# Patient Record
Sex: Female | Born: 1980 | Race: Black or African American | Hispanic: No | Marital: Married | Smoking: Never smoker
Health system: Southern US, Community
[De-identification: ages and names within clinical notes are randomized; demographics above are authoritative.]

## PROBLEM LIST (undated history)

## (undated) ENCOUNTER — Inpatient Hospital Stay (HOSPITAL_COMMUNITY): Payer: Self-pay

## (undated) DIAGNOSIS — N643 Galactorrhea not associated with childbirth: Secondary | ICD-10-CM

## (undated) DIAGNOSIS — B370 Candidal stomatitis: Secondary | ICD-10-CM

## (undated) DIAGNOSIS — I10 Essential (primary) hypertension: Secondary | ICD-10-CM

## (undated) DIAGNOSIS — R87629 Unspecified abnormal cytological findings in specimens from vagina: Secondary | ICD-10-CM

## (undated) DIAGNOSIS — D649 Anemia, unspecified: Secondary | ICD-10-CM

## (undated) HISTORY — DX: Galactorrhea not associated with childbirth: N64.3

## (undated) HISTORY — DX: Unspecified abnormal cytological findings in specimens from vagina: R87.629

## (undated) HISTORY — DX: Candidal stomatitis: B37.0

## (undated) HISTORY — DX: Essential (primary) hypertension: I10

---

## 2006-04-17 ENCOUNTER — Emergency Department (HOSPITAL_COMMUNITY): Admission: EM | Admit: 2006-04-17 | Discharge: 2006-04-18 | Payer: Self-pay | Admitting: Emergency Medicine

## 2007-11-05 ENCOUNTER — Emergency Department (HOSPITAL_COMMUNITY): Admission: EM | Admit: 2007-11-05 | Discharge: 2007-11-05 | Payer: Self-pay | Admitting: Emergency Medicine

## 2008-03-27 ENCOUNTER — Encounter: Admission: RE | Admit: 2008-03-27 | Discharge: 2008-03-27 | Payer: Self-pay | Admitting: Gastroenterology

## 2008-03-29 ENCOUNTER — Ambulatory Visit (HOSPITAL_COMMUNITY): Admission: RE | Admit: 2008-03-29 | Discharge: 2008-03-29 | Payer: Self-pay | Admitting: Gastroenterology

## 2010-01-29 ENCOUNTER — Encounter: Admission: RE | Admit: 2010-01-29 | Discharge: 2010-01-29 | Payer: Self-pay | Admitting: Family Medicine

## 2011-10-20 HISTORY — PX: DILATION AND CURETTAGE OF UTERUS: SHX78

## 2012-06-24 ENCOUNTER — Ambulatory Visit (INDEPENDENT_AMBULATORY_CARE_PROVIDER_SITE_OTHER): Payer: Managed Care, Other (non HMO) | Admitting: Internal Medicine

## 2012-06-24 ENCOUNTER — Encounter: Payer: Self-pay | Admitting: Internal Medicine

## 2012-06-24 ENCOUNTER — Ambulatory Visit (INDEPENDENT_AMBULATORY_CARE_PROVIDER_SITE_OTHER)
Admission: RE | Admit: 2012-06-24 | Discharge: 2012-06-24 | Disposition: A | Discharge status: Home / Self Care | Payer: Managed Care, Other (non HMO) | Source: Ambulatory Visit | Attending: Internal Medicine | Admitting: Internal Medicine

## 2012-06-24 VITALS — BP 118/70 | HR 77 | Temp 98.2°F | Ht 62.0 in | Wt 156.4 lb

## 2012-06-24 DIAGNOSIS — R0602 Shortness of breath: Secondary | ICD-10-CM

## 2012-06-24 DIAGNOSIS — I10 Essential (primary) hypertension: Secondary | ICD-10-CM

## 2012-06-24 MED ORDER — NEBIVOLOL HCL 10 MG PO TABS: 10.0000 mg | ORAL_TABLET | Freq: Every day | ORAL | Status: DC

## 2012-06-24 NOTE — Progress Notes (Signed)
  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  15 min on eliptical sob and light headed and arthritis comes and go, doesn't really correlate with sob.  No obvious daytime variabilty or assoc chronic cough or cp or chest tightness, subjective wheeze overt sinus or hb symptoms. No unusual exp hx or h/o childhood pna/ asthma or premature birth to her knowledge.   ROS  The following are not active complaints unless bolded sore throat, dysphagia, dental problems, itching, sneezing,  nasal congestion or excess/ purulent secretions, ear ache,   fever, chills, sweats, unintended wt loss, pleuritic or exertional cp, hemoptysis,  orthopnea pnd or leg swelling, presyncope, palpitations, heartburn, abdominal pain, anorexia, nausea, vomiting, diarrhea  or change in bowel or urinary habits, change in stools or urine, dysuria,hematuria,  rash, arthralgias, visual complaints, headache, numbness weakness or ataxia or problems with walking or coordination,  change in mood/affect or memory.       Review of Systems  Constitutional: Negative for fever, chills and unexpected weight change.  HENT: Negative for ear pain, nosebleeds, congestion, sore throat, rhinorrhea, sneezing, trouble swallowing, dental problem, voice change, postnasal drip and sinus pressure.   Eyes: Negative for visual disturbance.  Respiratory: Positive for shortness of breath. Negative for cough and choking.   Cardiovascular: Positive for chest pain. Negative for leg swelling.  Gastrointestinal:  Negative for vomiting, abdominal pain and diarrhea.  Genitourinary: Negative for difficulty urinating.  Musculoskeletal: Positive for arthralgias.  Skin: Negative for rash.  Neurological: Negative for tremors, syncope and headaches.  Hematological: Does not bruise/bleed easily.       Objective:   Physical Exam  amb pleasant bf nad Wt Readings from Last 3 Encounters:  06/24/12 156 lb 6.4 oz (70.943 kg)    HEENT: nl dentition, turbinates, and orophanx. Nl external ear canals without cough reflex   NECK :  without JVD/Nodes/TM/ nl carotid upstrokes bilaterally   LUNGS: no acc muscle use, clear to A and P bilaterally without cough on insp or exp maneuvers   CV:  RRR  no s3 or murmur or increase in P2, no edema   ABD:  soft and nontender with nl excursion in the supine position. No bruits or organomegaly, bowel sounds nl  MS:  warm without deformities, calf tenderness, cyanosis or clubbing  SKIN: warm and dry without lesions    NEURO:  alert, approp, no deficits    CXR  06/24/2012 :  Negative.          Assessment & Plan:

## 2012-06-24 NOTE — Patient Instructions (Addendum)
Stop labetolol an start bystolic 10 mg one twice daily in its place.  Call [facility] [phone] to schedule cpst with spirometry before and after but only 2 weeks on prilosec 20 mg Take 30-60 min before first meal of the day and Pepcid 20 g at bedtime   Please remember to go to the xray   department downstairs for your tests - we will call you with the results when they are available.

## 2012-06-25 DIAGNOSIS — I10 Essential (primary) hypertension: Secondary | ICD-10-CM | POA: Insufficient documentation

## 2012-06-25 NOTE — Assessment & Plan Note (Addendum)
-   06/24/2012  Walked RA x 3 laps @ 185 ft each stopped due to  End of study, no desats, peak pulse 97 -Spirometry wnl p exercise 06/24/2012   Symptoms are markedly disproportionate to objective findings and not clear this is a lung problem but pt does appear to have difficult airway management issues.   DDX of  difficult airways managment all start with A and  include Adherence, Ace Inhibitors, Acid Reflux, Active Sinus Disease, Alpha 1 Antitripsin deficiency, Anxiety masquerading as Airways dz,  ABPA,  allergy(esp in young), Aspiration (esp in elderly), Adverse effects of DPI,  Active smokers, plus two Bs  = Bronchiectasis and Beta blocker use..and one C= CHF   Note problem started on labetolol and taking in relatively high doses so makes sense to try bystolic > see hbp section  If not better then cpst with  before and after spirometry is next step.

## 2012-06-25 NOTE — Assessment & Plan Note (Signed)
Strongly prefer in this setting: Bystolic, the most beta -1  selective Beta blocker available in sample form, with bisoprolol the most selective generic choice  on the market.  

## 2012-07-15 NOTE — Progress Notes (Signed)
Quick Note:  Spoke with pt and notified of results per Dr. Wert. Pt verbalized understanding and denied any questions.  ______ 

## 2013-02-24 ENCOUNTER — Other Ambulatory Visit: Payer: Self-pay

## 2013-04-21 NOTE — L&D Delivery Note (Signed)
Delivery Note At 3:32 PM a viable and healthy female was delivered via Vaginal, Spontaneous Delivery (Presentation: Right Occiput Anterior).  APGAR: 9, 10; weight 5 lb 13 oz (2637 g).   Placenta status: Intact, Spontaneous. Not sent  Cord: 3 vessels with the following complications: .  Cord pH: none  Anesthesia: None  Episiotomy: None Lacerations: None Suture Repair: n/a Est. Blood Loss (mL): 250  Mom to postpartum.  Baby to Couplet care / Skin to Skin.  Dariel Pellecchia A 04/03/2014, 5:54 PM

## 2013-09-08 LAB — OB RESULTS CONSOLE RUBELLA ANTIBODY, IGM: RUBELLA: IMMUNE

## 2013-09-08 LAB — OB RESULTS CONSOLE ANTIBODY SCREEN: ANTIBODY SCREEN: NEGATIVE

## 2013-09-08 LAB — OB RESULTS CONSOLE RPR: RPR: NONREACTIVE

## 2013-09-08 LAB — OB RESULTS CONSOLE ABO/RH: RH Type: POSITIVE

## 2013-09-08 LAB — OB RESULTS CONSOLE GC/CHLAMYDIA
Chlamydia: NEGATIVE
Gonorrhea: NEGATIVE

## 2013-09-08 LAB — OB RESULTS CONSOLE HEPATITIS B SURFACE ANTIGEN: Hepatitis B Surface Ag: NEGATIVE

## 2013-09-08 LAB — OB RESULTS CONSOLE HIV ANTIBODY (ROUTINE TESTING): HIV: NONREACTIVE

## 2014-03-22 ENCOUNTER — Inpatient Hospital Stay (HOSPITAL_COMMUNITY)
Admission: AD | Admit: 2014-03-22 | Discharge: 2014-03-22 | Disposition: A | Discharge status: Home / Self Care | Payer: Managed Care, Other (non HMO) | Source: Ambulatory Visit | Attending: Obstetrics and Gynecology | Admitting: Obstetrics and Gynecology

## 2014-03-22 ENCOUNTER — Encounter (HOSPITAL_COMMUNITY): Payer: Self-pay

## 2014-03-22 DIAGNOSIS — Z3A36 36 weeks gestation of pregnancy: Secondary | ICD-10-CM | POA: Insufficient documentation

## 2014-03-22 DIAGNOSIS — O10013 Pre-existing essential hypertension complicating pregnancy, third trimester: Secondary | ICD-10-CM | POA: Insufficient documentation

## 2014-03-22 HISTORY — DX: Anemia, unspecified: D64.9

## 2014-03-22 LAB — CBC
HCT: 32.3 % — ABNORMAL LOW (ref 36.0–46.0)
HEMOGLOBIN: 10.6 g/dL — AB (ref 12.0–15.0)
MCH: 28.8 pg (ref 26.0–34.0)
MCHC: 32.8 g/dL (ref 30.0–36.0)
MCV: 87.8 fL (ref 78.0–100.0)
PLATELETS: 156 10*3/uL (ref 150–400)
RBC: 3.68 MIL/uL — AB (ref 3.87–5.11)
RDW: 13.6 % (ref 11.5–15.5)
WBC: 6.1 10*3/uL (ref 4.0–10.5)

## 2014-03-22 LAB — COMPREHENSIVE METABOLIC PANEL
ALK PHOS: 113 U/L (ref 39–117)
ALT: 15 U/L (ref 0–35)
AST: 18 U/L (ref 0–37)
Albumin: 2.8 g/dL — ABNORMAL LOW (ref 3.5–5.2)
Anion gap: 13 (ref 5–15)
BUN: 6 mg/dL (ref 6–23)
CHLORIDE: 101 meq/L (ref 96–112)
CO2: 22 meq/L (ref 19–32)
Calcium: 9.1 mg/dL (ref 8.4–10.5)
Creatinine, Ser: 0.65 mg/dL (ref 0.50–1.10)
GLUCOSE: 94 mg/dL (ref 70–99)
POTASSIUM: 3.8 meq/L (ref 3.7–5.3)
SODIUM: 136 meq/L — AB (ref 137–147)
Total Bilirubin: 0.6 mg/dL (ref 0.3–1.2)
Total Protein: 6.7 g/dL (ref 6.0–8.3)

## 2014-03-22 LAB — PROTEIN / CREATININE RATIO, URINE
Creatinine, Urine: 130.84 mg/dL
Protein Creatinine Ratio: 0.16 — ABNORMAL HIGH (ref 0.00–0.15)
Total Protein, Urine: 20.9 mg/dL

## 2014-03-22 LAB — URIC ACID: Uric Acid, Serum: 4.8 mg/dL (ref 2.4–7.0)

## 2014-03-22 MED ORDER — NIFEDIPINE ER OSMOTIC RELEASE 30 MG PO TB24
30.0000 mg | ORAL_TABLET | Freq: Once | ORAL | Status: AC
  Administered 2014-03-22: 30 mg via ORAL
  Filled 2014-03-22: qty 1

## 2014-03-22 NOTE — Discharge Instructions (Signed)
Third Trimester of Pregnancy °The third trimester is from week 29 through week 42, months 7 through 9. The third trimester is a time when the fetus is growing rapidly. At the end of the ninth month, the fetus is about 20 inches in length and weighs 6-10 pounds.  °BODY CHANGES °Your body goes through many changes during pregnancy. The changes vary from woman to woman.  °· Your weight will continue to increase. You can expect to gain 25-35 pounds (11-16 kg) by the end of the pregnancy. °· You may begin to get stretch marks on your hips, abdomen, and breasts. °· You may urinate more often because the fetus is moving lower into your pelvis and pressing on your bladder. °· You may develop or continue to have heartburn as a result of your pregnancy. °· You may develop constipation because certain hormones are causing the muscles that push waste through your intestines to slow down. °· You may develop hemorrhoids or swollen, bulging veins (varicose veins). °· You may have pelvic pain because of the weight gain and pregnancy hormones relaxing your joints between the bones in your pelvis. Backaches may result from overexertion of the muscles supporting your posture. °· You may have changes in your hair. These can include thickening of your hair, rapid growth, and changes in texture. Some women also have hair loss during or after pregnancy, or hair that feels dry or thin. Your hair will most likely return to normal after your baby is born. °· Your breasts will continue to grow and be tender. A yellow discharge may leak from your breasts called colostrum. °· Your belly button may stick out. °· You may feel short of breath because of your expanding uterus. °· You may notice the fetus "dropping," or moving lower in your abdomen. °· You may have a bloody mucus discharge. This usually occurs a few days to a week before labor begins. °· Your cervix becomes thin and soft (effaced) near your due date. °WHAT TO EXPECT AT YOUR PRENATAL  EXAMS  °You will have prenatal exams every 2 weeks until week 36. Then, you will have weekly prenatal exams. During a routine prenatal visit: °· You will be weighed to make sure you and the fetus are growing normally. °· Your blood pressure is taken. °· Your abdomen will be measured to track your baby's growth. °· The fetal heartbeat will be listened to. °· Any test results from the previous visit will be discussed. °· You may have a cervical check near your due date to see if you have effaced. °At around 36 weeks, your caregiver will check your cervix. At the same time, your caregiver will also perform a test on the secretions of the vaginal tissue. This test is to determine if a type of bacteria, Group B streptococcus, is present. Your caregiver will explain this further. °Your caregiver may ask you: °· What your birth plan is. °· How you are feeling. °· If you are feeling the baby move. °· If you have had any abnormal symptoms, such as leaking fluid, bleeding, severe headaches, or abdominal cramping. °· If you have any questions. °Other tests or screenings that may be performed during your third trimester include: °· Blood tests that check for low iron levels (anemia). °· Fetal testing to check the health, activity level, and growth of the fetus. Testing is done if you have certain medical conditions or if there are problems during the pregnancy. °FALSE LABOR °You may feel small, irregular contractions that   eventually go away. These are called Braxton Hicks contractions, or false labor. Contractions may last for hours, days, or even weeks before true labor sets in. If contractions come at regular intervals, intensify, or become painful, it is best to be seen by your caregiver.  °SIGNS OF LABOR  °· Menstrual-like cramps. °· Contractions that are 5 minutes apart or less. °· Contractions that start on the top of the uterus and spread down to the lower abdomen and back. °· A sense of increased pelvic pressure or back  pain. °· A watery or bloody mucus discharge that comes from the vagina. °If you have any of these signs before the 37th week of pregnancy, call your caregiver right away. You need to go to the hospital to get checked immediately. °HOME CARE INSTRUCTIONS  °· Avoid all smoking, herbs, alcohol, and unprescribed drugs. These chemicals affect the formation and growth of the baby. °· Follow your caregiver's instructions regarding medicine use. There are medicines that are either safe or unsafe to take during pregnancy. °· Exercise only as directed by your caregiver. Experiencing uterine cramps is a good sign to stop exercising. °· Continue to eat regular, healthy meals. °· Wear a good support bra for breast tenderness. °· Do not use hot tubs, steam rooms, or saunas. °· Wear your seat belt at all times when driving. °· Avoid raw meat, uncooked cheese, cat litter boxes, and soil used by cats. These carry germs that can cause birth defects in the baby. °· Take your prenatal vitamins. °· Try taking a stool softener (if your caregiver approves) if you develop constipation. Eat more high-fiber foods, such as fresh vegetables or fruit and whole grains. Drink plenty of fluids to keep your urine clear or pale yellow. °· Take warm sitz baths to soothe any pain or discomfort caused by hemorrhoids. Use hemorrhoid cream if your caregiver approves. °· If you develop varicose veins, wear support hose. Elevate your feet for 15 minutes, 3-4 times a day. Limit salt in your diet. °· Avoid heavy lifting, wear low heal shoes, and practice good posture. °· Rest a lot with your legs elevated if you have leg cramps or low back pain. °· Visit your dentist if you have not gone during your pregnancy. Use a soft toothbrush to brush your teeth and be gentle when you floss. °· A sexual relationship may be continued unless your caregiver directs you otherwise. °· Do not travel far distances unless it is absolutely necessary and only with the approval  of your caregiver. °· Take prenatal classes to understand, practice, and ask questions about the labor and delivery. °· Make a trial run to the hospital. °· Pack your hospital bag. °· Prepare the baby's nursery. °· Continue to go to all your prenatal visits as directed by your caregiver. °SEEK MEDICAL CARE IF: °· You are unsure if you are in labor or if your water has broken. °· You have dizziness. °· You have mild pelvic cramps, pelvic pressure, or nagging pain in your abdominal area. °· You have persistent nausea, vomiting, or diarrhea. °· You have a bad smelling vaginal discharge. °· You have pain with urination. °SEEK IMMEDIATE MEDICAL CARE IF:  °· You have a fever. °· You are leaking fluid from your vagina. °· You have spotting or bleeding from your vagina. °· You have severe abdominal cramping or pain. °· You have rapid weight loss or gain. °· You have shortness of breath with chest pain. °· You notice sudden or extreme swelling   of your face, hands, ankles, feet, or legs. °· You have not felt your baby move in over an hour. °· You have severe headaches that do not go away with medicine. °· You have vision changes. °Document Released: 04/01/2001 Document Revised: 04/12/2013 Document Reviewed: 06/08/2012 °[employer]® Patient Information ©2015 [employer]. This information is not intended to replace advice given to you by your health care provider. Make sure you discuss any questions you have with your health care provider. °Fetal Movement Counts °Patient Name: __________________________________________________ Patient Due Date: ____________________ °Performing a fetal movement count is highly recommended in high-risk pregnancies, but it is good for every pregnant woman to do. Your health care provider may ask you to start counting fetal movements at 28 weeks of the pregnancy. Fetal movements often increase: °· After eating a full meal. °· After physical activity. °· After eating or drinking something sweet or  cold. °· At rest. °Pay attention to when you feel the baby is most active. This will help you notice a pattern of your baby's sleep and wake cycles and what factors contribute to an increase in fetal movement. It is important to perform a fetal movement count at the same time each day when your baby is normally most active.  °HOW TO COUNT FETAL MOVEMENTS °· Find a quiet and comfortable area to sit or lie down on your left side. Lying on your left side provides the best blood and oxygen circulation to your baby. °· Write down the day and time on a sheet of paper or in a journal. °· Start counting kicks, flutters, swishes, rolls, or jabs in a 2-hour period. You should feel at least 10 movements within 2 hours. °· If you do not feel 10 movements in 2 hours, wait 2-3 hours and count again. Look for a change in the pattern or not enough counts in 2 hours. °SEEK MEDICAL CARE IF: °· You feel less than 10 counts in 2 hours, tried twice. °· There is no movement in over an hour. °· The pattern is changing or taking longer each day to reach 10 counts in 2 hours. °· You feel the baby is not moving as he or she usually does. °Date: ____________ Movements: ____________ Start time: ____________ Finish time: ____________  °Date: ____________ Movements: ____________ Start time: ____________ Finish time: ____________ °Date: ____________ Movements: ____________ Start time: ____________ Finish time: ____________ °Date: ____________ Movements: ____________ Start time: ____________ Finish time: ____________ °Date: ____________ Movements: ____________ Start time: ____________ Finish time: ____________ °Date: ____________ Movements: ____________ Start time: ____________ Finish time: ____________ °Date: ____________ Movements: ____________ Start time: ____________ Finish time: ____________ °Date: ____________ Movements: ____________ Start time: ____________ Finish time: ____________  °Date: ____________ Movements: ____________ Start time:  ____________ Finish time: ____________ °Date: ____________ Movements: ____________ Start time: ____________ Finish time: ____________ °Date: ____________ Movements: ____________ Start time: ____________ Finish time: ____________ °Date: ____________ Movements: ____________ Start time: ____________ Finish time: ____________ °Date: ____________ Movements: ____________ Start time: ____________ Finish time: ____________ °Date: ____________ Movements: ____________ Start time: ____________ Finish time: ____________ °Date: ____________ Movements: ____________ Start time: ____________ Finish time: ____________  °Date: ____________ Movements: ____________ Start time: ____________ Finish time: ____________ °Date: ____________ Movements: ____________ Start time: ____________ Finish time: ____________ °Date: ____________ Movements: ____________ Start time: ____________ Finish time: ____________ °Date: ____________ Movements: ____________ Start time: ____________ Finish time: ____________ °Date: ____________ Movements: ____________ Start time: ____________ Finish time: ____________ °Date: ____________ Movements: ____________ Start time: ____________ Finish time: ____________ °Date: ____________ Movements: ____________ Start time: ____________ Finish time:   ____________  °Date: ____________ Movements: ____________ Start time: ____________ Finish time: ____________ °Date: ____________ Movements: ____________ Start time: ____________ Finish time: ____________ °Date: ____________ Movements: ____________ Start time: ____________ Finish time: ____________ °Date: ____________ Movements: ____________ Start time: ____________ Finish time: ____________ °Date: ____________ Movements: ____________ Start time: ____________ Finish time: ____________ °Date: ____________ Movements: ____________ Start time: ____________ Finish time: ____________ °Date: ____________ Movements: ____________ Start time: ____________ Finish time: ____________  °Date:  ____________ Movements: ____________ Start time: ____________ Finish time: ____________ °Date: ____________ Movements: ____________ Start time: ____________ Finish time: ____________ °Date: ____________ Movements: ____________ Start time: ____________ Finish time: ____________ °Date: ____________ Movements: ____________ Start time: ____________ Finish time: ____________ °Date: ____________ Movements: ____________ Start time: ____________ Finish time: ____________ °Date: ____________ Movements: ____________ Start time: ____________ Finish time: ____________ °Date: ____________ Movements: ____________ Start time: ____________ Finish time: ____________  °Date: ____________ Movements: ____________ Start time: ____________ Finish time: ____________ °Date: ____________ Movements: ____________ Start time: ____________ Finish time: ____________ °Date: ____________ Movements: ____________ Start time: ____________ Finish time: ____________ °Date: ____________ Movements: ____________ Start time: ____________ Finish time: ____________ °Date: ____________ Movements: ____________ Start time: ____________ Finish time: ____________ °Date: ____________ Movements: ____________ Start time: ____________ Finish time: ____________ °Date: ____________ Movements: ____________ Start time: ____________ Finish time: ____________  °Date: ____________ Movements: ____________ Start time: ____________ Finish time: ____________ °Date: ____________ Movements: ____________ Start time: ____________ Finish time: ____________ °Date: ____________ Movements: ____________ Start time: ____________ Finish time: ____________ °Date: ____________ Movements: ____________ Start time: ____________ Finish time: ____________ °Date: ____________ Movements: ____________ Start time: ____________ Finish time: ____________ °Date: ____________ Movements: ____________ Start time: ____________ Finish time: ____________ °Date: ____________ Movements: ____________ Start  time: ____________ Finish time: ____________  °Date: ____________ Movements: ____________ Start time: ____________ Finish time: ____________ °Date: ____________ Movements: ____________ Start time: ____________ Finish time: ____________ °Date: ____________ Movements: ____________ Start time: ____________ Finish time: ____________ °Date: ____________ Movements: ____________ Start time: ____________ Finish time: ____________ °Date: ____________ Movements: ____________ Start time: ____________ Finish time: ____________ °Date: ____________ Movements: ____________ Start time: ____________ Finish time: ____________ °Document Released: 05/07/2006 Document Revised: 08/22/2013 Document Reviewed: 02/02/2012 °[employer]® Patient Information ©2015 [employer]. This information is not intended to replace advice given to you by your health care provider. Make sure you discuss any questions you have with your health care provider. ° °

## 2014-03-22 NOTE — MAU Provider Note (Signed)
History     Chief Complaint  Patient presents with  . BP Eval   HPI; 33 yo [id number] MBF @ 36  3/[redacted] weeks gestation with known chronic HTN  Sent from  Office for evaluation of superimposed preeclampsia vs worsening HTN. Pt started pregnancy on procardia and bystolic however pt began having low BP and symptomatic due to the BP resulting in BP meds being discontinued. Pt did well until a week ago when first elevation of BP noted. PIH labs done were normal. Today pt presents for routine care and was found to have BP 150/100. Pt here for serial BP, PIH labs and NST. Sono done in office had BPP 8/8, 5lb 14 oz baby( 41%) AC( 9%) nl dopplers. Pt denies PIH warning signs. (+) active fetus. Notes more frequent ctx. Hx PTB OB History    Gravida Para Term Preterm AB TAB SAB Ectopic Multiple Living   3 2 1 1      2       Past Medical History  Diagnosis Date  . Hypertension   . Anemia     Past Surgical History  Procedure Laterality Date  . Dilation and curettage of uterus  July 2013    Family History  Problem Relation Age of Onset  . Asthma Maternal Grandmother   . Asthma Maternal Aunt     History  Substance Use Topics  . Smoking status: Never Smoker   . Smokeless tobacco: Never Used  . Alcohol Use: No    Allergies: No Known Allergies  Prescriptions prior to admission  Medication Sig Dispense Refill Last Dose  . aspirin 81 MG tablet Take 81 mg by mouth daily.   03/21/2014 at Unknown time  . folic acid (FOLVITE) 1 MG tablet Take 1 tablet by mouth daily.   03/21/2014 at Unknown time  . Prenatal Vit-Fe Fumarate-FA (PRENATAL MULTIVITAMIN) TABS tablet Take 1 tablet by mouth daily at 12 noon.   03/21/2014 at Unknown time  . nebivolol (BYSTOLIC) 10 MG tablet Take 1 tablet (10 mg total) by mouth daily. (Patient not taking: Reported on 03/22/2014) 60 tablet 11      Physical Exam   Blood pressure 135/88, pulse 115, temperature 98 F (36.7 C), resp. rate 18.  No exam performed today, done in  office. pt sent for labs and serial BP/NST. CBC    Component Value Date/Time   WBC 6.1 03/22/2014 1324   RBC 3.68* 03/22/2014 1324   HGB 10.6* 03/22/2014 1324   HCT 32.3* 03/22/2014 1324   PLT 156 03/22/2014 1324   MCV 87.8 03/22/2014 1324   MCH 28.8 03/22/2014 1324   MCHC 32.8 03/22/2014 1324   RDW 13.6 03/22/2014 1324   CMP     Component Value Date/Time   NA 136* 03/22/2014 1324   K 3.8 03/22/2014 1324   CL 101 03/22/2014 1324   CO2 22 03/22/2014 1324   GLUCOSE 94 03/22/2014 1324   BUN 6 03/22/2014 1324   CREATININE 0.65 03/22/2014 1324   CALCIUM 9.1 03/22/2014 1324   PROT 6.7 03/22/2014 1324   ALBUMIN 2.8* 03/22/2014 1324   AST 18 03/22/2014 1324   ALT 15 03/22/2014 1324   ALKPHOS 113 03/22/2014 1324   BILITOT 0.6 03/22/2014 1324   GFRNONAA >90 03/22/2014 1324   GFRAA >90 03/22/2014 1324   Tracing: baseline 145 (+) accels  ctx q 2-5 mins with uterine irritability  ED Course   Chronic HTn w/o evidence of superimposed preeclampsia IUP @ 36 3/7 weeks P) resume  procardia XL. Start today. PIH warning signs. F/u office Monday. Labor prec MDM   Serita KyleOUSINS,Treva Huyett A, MD 2:10 PM 03/22/2014

## 2014-03-22 NOTE — MAU Note (Signed)
Pt here from Dr. Cherly Hensenousins office for BP eval. Denies headache, vision changes or pain. Reports good fetal movement. Denies vaginal bleeding, discharge or leaking of fluid.

## 2014-03-22 NOTE — Progress Notes (Signed)
Dr. Cherly Hensenousins notified of pt lab results, FHR tracing. Received orders to discharge pt and write note for work. Preeclampsia signs and symptoms reviewed.

## 2014-03-27 ENCOUNTER — Telehealth (HOSPITAL_COMMUNITY): Payer: Self-pay | Admitting: *Deleted

## 2014-03-27 ENCOUNTER — Encounter (HOSPITAL_COMMUNITY): Payer: Self-pay | Admitting: *Deleted

## 2014-03-27 NOTE — Telephone Encounter (Signed)
Preadmission screen  

## 2014-03-28 ENCOUNTER — Encounter (HOSPITAL_COMMUNITY): Payer: Self-pay | Admitting: *Deleted

## 2014-03-28 ENCOUNTER — Inpatient Hospital Stay (HOSPITAL_COMMUNITY)
Admission: AD | Admit: 2014-03-28 | Discharge: 2014-03-28 | Disposition: A | Discharge status: Home / Self Care | Payer: Managed Care, Other (non HMO) | Source: Ambulatory Visit | Attending: Obstetrics & Gynecology | Admitting: Obstetrics & Gynecology

## 2014-03-28 ENCOUNTER — Inpatient Hospital Stay (HOSPITAL_COMMUNITY): Payer: Managed Care, Other (non HMO)

## 2014-03-28 DIAGNOSIS — O368190 Decreased fetal movements, unspecified trimester, not applicable or unspecified: Secondary | ICD-10-CM | POA: Insufficient documentation

## 2014-03-28 DIAGNOSIS — Z3A37 37 weeks gestation of pregnancy: Secondary | ICD-10-CM | POA: Diagnosis not present

## 2014-03-28 DIAGNOSIS — O10013 Pre-existing essential hypertension complicating pregnancy, third trimester: Secondary | ICD-10-CM | POA: Insufficient documentation

## 2014-03-28 DIAGNOSIS — O368130 Decreased fetal movements, third trimester, not applicable or unspecified: Secondary | ICD-10-CM | POA: Insufficient documentation

## 2014-03-28 DIAGNOSIS — O139 Gestational [pregnancy-induced] hypertension without significant proteinuria, unspecified trimester: Secondary | ICD-10-CM | POA: Insufficient documentation

## 2014-03-28 DIAGNOSIS — O368131 Decreased fetal movements, third trimester, fetus 1: Secondary | ICD-10-CM

## 2014-03-28 LAB — COMPREHENSIVE METABOLIC PANEL
ALK PHOS: 124 U/L — AB (ref 39–117)
ALT: 13 U/L (ref 0–35)
ANION GAP: 14 (ref 5–15)
AST: 17 U/L (ref 0–37)
Albumin: 2.9 g/dL — ABNORMAL LOW (ref 3.5–5.2)
BILIRUBIN TOTAL: 0.7 mg/dL (ref 0.3–1.2)
BUN: 8 mg/dL (ref 6–23)
CO2: 21 meq/L (ref 19–32)
Calcium: 9.1 mg/dL (ref 8.4–10.5)
Chloride: 103 mEq/L (ref 96–112)
Creatinine, Ser: 0.72 mg/dL (ref 0.50–1.10)
GLUCOSE: 74 mg/dL (ref 70–99)
POTASSIUM: 3.9 meq/L (ref 3.7–5.3)
SODIUM: 138 meq/L (ref 137–147)
Total Protein: 6.9 g/dL (ref 6.0–8.3)

## 2014-03-28 LAB — URIC ACID: Uric Acid, Serum: 5.3 mg/dL (ref 2.4–7.0)

## 2014-03-28 LAB — URINE MICROSCOPIC-ADD ON

## 2014-03-28 LAB — CBC
HEMATOCRIT: 32.4 % — AB (ref 36.0–46.0)
HEMOGLOBIN: 11 g/dL — AB (ref 12.0–15.0)
MCH: 29.9 pg (ref 26.0–34.0)
MCHC: 34 g/dL (ref 30.0–36.0)
MCV: 88 fL (ref 78.0–100.0)
Platelets: 164 10*3/uL (ref 150–400)
RBC: 3.68 MIL/uL — AB (ref 3.87–5.11)
RDW: 13.4 % (ref 11.5–15.5)
WBC: 5.2 10*3/uL (ref 4.0–10.5)

## 2014-03-28 LAB — URINALYSIS, ROUTINE W REFLEX MICROSCOPIC
BILIRUBIN URINE: NEGATIVE
GLUCOSE, UA: NEGATIVE mg/dL
KETONES UR: NEGATIVE mg/dL
LEUKOCYTES UA: NEGATIVE
NITRITE: NEGATIVE
PH: 6 (ref 5.0–8.0)
Protein, ur: NEGATIVE mg/dL
Urobilinogen, UA: 0.2 mg/dL (ref 0.0–1.0)

## 2014-03-28 NOTE — MAU Note (Signed)
E-sign not working. Pt signed hard copy. No pain

## 2014-03-28 NOTE — Progress Notes (Signed)
Dr Cherly Hensenousins and Donette LarryMelanie Bhambri CNM in to see pt. Discussing lab results and d/c plan. Written and verbal d/c instructions given and understanding voiced. Preeclampsia s/s discussed and handout given

## 2014-03-28 NOTE — Discharge Instructions (Signed)

## 2014-03-28 NOTE — MAU Provider Note (Signed)
History     CSN: [id number]  Arrival date and time: 03/28/14 1637   First Provider Initiated Contact with Patient 03/28/14 1837      Chief Complaint  Patient presents with  . Decreased Fetal Movement   HPI Comments: G3P1102 @37 .2 wks here for c/o decreased FM today. No LOF or VB. Occ BH ctx. Pregnancy complicated by chronic HTN, not on meds during pregnancy but started on Procardia 1 wk ago d/t BP elevations. PIH labs normal at that time. No HA, visual disturbances, or epigastric pain. Reports feeling "cloudy" at times.   OB History    Gravida Para Term Preterm AB TAB SAB Ectopic Multiple Living   3 2 1 1      2       Past Medical History  Diagnosis Date  . Hypertension   . Anemia   . Candidiasis of mouth   . Vaginal Pap smear, abnormal   . Galactorrhea not associated with childbirth     Past Surgical History  Procedure Laterality Date  . Dilation and curettage of uterus  July 2013    Family History  Problem Relation Age of Onset  . Asthma Maternal Grandmother   . Hypertension Maternal Grandmother   . Asthma Maternal Aunt   . Hypertension Mother   . Hypertension Father   . Hypertension Maternal Grandfather     History  Substance Use Topics  . Smoking status: Never Smoker   . Smokeless tobacco: Never Used  . Alcohol Use: No    Allergies: No Known Allergies  Prescriptions prior to admission  Medication Sig Dispense Refill Last Dose  . aspirin 81 MG tablet Take 81 mg by mouth daily.   03/27/2014 at Unknown time  . folic acid (FOLVITE) 1 MG tablet Take 1 tablet by mouth daily.   03/27/2014 at Unknown time  . NIFEdipine (PROCARDIA-XL/ADALAT-CC/NIFEDICAL-XL) 30 MG 24 hr tablet Take 30 mg by mouth daily.   03/27/2014 at Unknown time  . Prenatal Vit-Fe Fumarate-FA (PRENATAL MULTIVITAMIN) TABS tablet Take 1 tablet by mouth daily at 12 noon.   03/27/2014 at Unknown time  . nebivolol (BYSTOLIC) 10 MG tablet Take 1 tablet (10 mg total) by mouth daily. (Patient not taking:  Reported on 03/22/2014) 60 tablet 11     Review of Systems  Constitutional: Negative.   HENT: Negative.   Eyes: Negative.   Respiratory: Negative.   Cardiovascular: Negative.   Gastrointestinal: Negative.   Genitourinary: Negative.   Musculoskeletal: Negative.   Skin: Negative.   Neurological: Negative.   Endo/Heme/Allergies: Negative.   Psychiatric/Behavioral: Negative.    Physical Exam   Blood pressure 134/71, pulse 92. BP range: 134-161/71-99  Physical Exam  Constitutional: She is oriented to person, place, and time. She appears well-developed and well-nourished.  HENT:  Head: Normocephalic and atraumatic.  Eyes: Pupils are equal, round, and reactive to light.  Neck: Normal range of motion. Neck supple.  Cardiovascular: Normal rate.   Respiratory: Effort normal.  GI: Soft.  Genitourinary:  deferred  Musculoskeletal: Normal range of motion.  Neurological: She is alert and oriented to person, place, and time.  Skin: Skin is warm and dry.  Psychiatric: She has a normal mood and affect.  EFM: 150 bpm, mod variability, +accels, no decels Toco: irregular, mild  Results for Loretta Dixon, Loretta Dixon (MRN [id number]) as of 03/28/2014 19:02  Ref. Range 03/28/2014 17:35  Sodium Latest Range: 137-147 mEq/L 138  Potassium Latest Range: 3.7-5.3 mEq/L 3.9  Chloride Latest Range: 96-112 mEq/L 103  CO2 Latest  Range: 19-32 mEq/L 21  BUN Latest Range: 6-23 mg/dL 8  Creatinine Latest Range: 0.50-1.10 mg/dL 7.420.72  Calcium Latest Range: 8.4-10.5 mg/dL 9.1  GFR calc non Af Amer Latest Range: >90 mL/min >90  GFR calc Af Amer Latest Range: >90 mL/min >90  Glucose Latest Range: 70-99 mg/dL 74  Anion gap Latest Range: 5-15  14  Alkaline Phosphatase Latest Range: 39-117 U/L 124 (H)  Albumin Latest Range: 3.5-5.2 g/dL 2.9 (L)  Uric Acid, Serum Latest Range: 2.4-7.0 mg/dL 5.3  AST Latest Range: 0-37 U/L 17  ALT Latest Range: 0-35 U/L 13  Total Protein Latest Range: 6.0-8.3 g/dL 6.9  Total  Bilirubin Latest Range: 0.3-1.2 mg/dL 0.7  WBC Latest Range: 4.0-10.5 K/uL 5.2  RBC Latest Range: 3.87-5.11 MIL/uL 3.68 (L)  Hemoglobin Latest Range: 12.0-15.0 g/dL 59.511.0 (L)  HCT Latest Range: 36.0-46.0 % 32.4 (L)  MCV Latest Range: 78.0-100.0 fL 88.0  MCH Latest Range: 26.0-34.0 pg 29.9  MCHC Latest Range: 30.0-36.0 g/dL 63.834.0  RDW Latest Range: 11.5-15.5 % 13.4  Platelets Latest Range: 150-400 K/uL 164   MAU Course  Procedures NST: reactive BPP: 8/9; AFI 16.57 cm Reported increased FM since arrival  MDM n/a  Assessment and Plan  37.[redacted] weeks gestation Decreased fetal movement Gestational HTN Reactive NST    Loretta Dixon, N 03/28/2014, 6:55 PM

## 2014-03-28 NOTE — Progress Notes (Signed)
Fabian NovemberM Bhambri notified of pt's admission and status. Aware of decreased FM today, had reactive nst yest. Hx HTN and sl IUGR. Orders received and will see pt

## 2014-03-28 NOTE — MAU Note (Addendum)
Loretta LarryMelanie Bhambri CNM in to see pt and discuss test results. OK to leave pt off efm per CNM

## 2014-03-29 DIAGNOSIS — Z3A37 37 weeks gestation of pregnancy: Secondary | ICD-10-CM | POA: Insufficient documentation

## 2014-03-29 DIAGNOSIS — O139 Gestational [pregnancy-induced] hypertension without significant proteinuria, unspecified trimester: Secondary | ICD-10-CM | POA: Insufficient documentation

## 2014-03-29 DIAGNOSIS — O368190 Decreased fetal movements, unspecified trimester, not applicable or unspecified: Secondary | ICD-10-CM | POA: Insufficient documentation

## 2014-04-03 ENCOUNTER — Inpatient Hospital Stay (HOSPITAL_COMMUNITY)
Admission: AD | Admit: 2014-04-03 | Discharge: 2014-04-04 | DRG: 775 | Disposition: A | Discharge status: Home / Self Care | Payer: Managed Care, Other (non HMO) | Source: Ambulatory Visit | Attending: Obstetrics and Gynecology | Admitting: Obstetrics and Gynecology

## 2014-04-03 ENCOUNTER — Other Ambulatory Visit: Payer: Self-pay | Admitting: Obstetrics and Gynecology

## 2014-04-03 ENCOUNTER — Encounter (HOSPITAL_COMMUNITY): Payer: Self-pay | Admitting: *Deleted

## 2014-04-03 DIAGNOSIS — Z3A38 38 weeks gestation of pregnancy: Secondary | ICD-10-CM | POA: Diagnosis present

## 2014-04-03 DIAGNOSIS — D62 Acute posthemorrhagic anemia: Secondary | ICD-10-CM | POA: Diagnosis not present

## 2014-04-03 DIAGNOSIS — Z3483 Encounter for supervision of other normal pregnancy, third trimester: Secondary | ICD-10-CM | POA: Diagnosis present

## 2014-04-03 DIAGNOSIS — O133 Gestational [pregnancy-induced] hypertension without significant proteinuria, third trimester: Secondary | ICD-10-CM | POA: Diagnosis present

## 2014-04-03 DIAGNOSIS — O10919 Unspecified pre-existing hypertension complicating pregnancy, unspecified trimester: Secondary | ICD-10-CM | POA: Diagnosis present

## 2014-04-03 LAB — CBC
HCT: 34.5 % — ABNORMAL LOW (ref 36.0–46.0)
Hemoglobin: 11.4 g/dL — ABNORMAL LOW (ref 12.0–15.0)
MCH: 28.9 pg (ref 26.0–34.0)
MCHC: 33 g/dL (ref 30.0–36.0)
MCV: 87.3 fL (ref 78.0–100.0)
PLATELETS: 165 10*3/uL (ref 150–400)
RBC: 3.95 MIL/uL (ref 3.87–5.11)
RDW: 13.7 % (ref 11.5–15.5)
WBC: 9.1 10*3/uL (ref 4.0–10.5)

## 2014-04-03 LAB — RPR

## 2014-04-03 LAB — TYPE AND SCREEN
ABO/RH(D): A POS
Antibody Screen: NEGATIVE

## 2014-04-03 LAB — ABO/RH: ABO/RH(D): A POS

## 2014-04-03 MED ORDER — SENNOSIDES-DOCUSATE SODIUM 8.6-50 MG PO TABS
2.0000 | ORAL_TABLET | ORAL | Status: DC
  Administered 2014-04-04: 2 via ORAL
  Filled 2014-04-03: qty 2

## 2014-04-03 MED ORDER — SIMETHICONE 80 MG PO CHEW: 80.0000 mg | CHEWABLE_TABLET | ORAL | Status: DC | PRN

## 2014-04-03 MED ORDER — LACTATED RINGERS IV SOLN
INTRAVENOUS | Status: DC
  Administered 2014-04-03: 11:00:00 via INTRAVENOUS

## 2014-04-03 MED ORDER — FLEET ENEMA 7-19 GM/118ML RE ENEM: 1.0000 | ENEMA | RECTAL | Status: DC | PRN

## 2014-04-03 MED ORDER — OXYCODONE-ACETAMINOPHEN 5-325 MG PO TABS: 2.0000 | ORAL_TABLET | ORAL | Status: DC | PRN

## 2014-04-03 MED ORDER — FERROUS SULFATE 325 (65 FE) MG PO TABS
325.0000 mg | ORAL_TABLET | Freq: Two times a day (BID) | ORAL | Status: DC
  Filled 2014-04-03 (×3): qty 1

## 2014-04-03 MED ORDER — IBUPROFEN 600 MG PO TABS
600.0000 mg | ORAL_TABLET | Freq: Four times a day (QID) | ORAL | Status: DC
  Administered 2014-04-03 – 2014-04-04 (×5): 600 mg via ORAL
  Filled 2014-04-03 (×5): qty 1

## 2014-04-03 MED ORDER — LANOLIN HYDROUS EX OINT: TOPICAL_OINTMENT | CUTANEOUS | Status: DC | PRN

## 2014-04-03 MED ORDER — CITRIC ACID-SODIUM CITRATE 334-500 MG/5ML PO SOLN: 30.0000 mL | ORAL | Status: DC | PRN

## 2014-04-03 MED ORDER — PRENATAL MULTIVITAMIN CH
1.0000 | ORAL_TABLET | Freq: Every day | ORAL | Status: DC
  Administered 2014-04-04: 1 via ORAL
  Filled 2014-04-03: qty 1

## 2014-04-03 MED ORDER — OXYTOCIN BOLUS FROM INFUSION: 500.0000 mL | INTRAVENOUS | Status: DC

## 2014-04-03 MED ORDER — OXYTOCIN 40 UNITS IN LACTATED RINGERS INFUSION - SIMPLE MED: 62.5000 mL/h | INTRAVENOUS | Status: DC

## 2014-04-03 MED ORDER — ONDANSETRON HCL 4 MG PO TABS: 4.0000 mg | ORAL_TABLET | ORAL | Status: DC | PRN

## 2014-04-03 MED ORDER — OXYCODONE-ACETAMINOPHEN 5-325 MG PO TABS: 1.0000 | ORAL_TABLET | ORAL | Status: DC | PRN

## 2014-04-03 MED ORDER — ACETAMINOPHEN 325 MG PO TABS: 650.0000 mg | ORAL_TABLET | ORAL | Status: DC | PRN

## 2014-04-03 MED ORDER — OXYTOCIN 40 UNITS IN LACTATED RINGERS INFUSION - SIMPLE MED
1.0000 m[IU]/min | INTRAVENOUS | Status: DC
  Administered 2014-04-03: 4 m[IU]/min via INTRAVENOUS
  Administered 2014-04-03: 2 m[IU]/min via INTRAVENOUS
  Administered 2014-04-03: 6 m[IU]/min via INTRAVENOUS
  Administered 2014-04-03: 8 m[IU]/min via INTRAVENOUS

## 2014-04-03 MED ORDER — ZOLPIDEM TARTRATE 5 MG PO TABS
5.0000 mg | ORAL_TABLET | Freq: Every evening | ORAL | Status: DC | PRN
Start: 2014-04-03 — End: 2014-04-04

## 2014-04-03 MED ORDER — LIDOCAINE HCL (PF) 1 % IJ SOLN: 30.0000 mL | INTRAMUSCULAR | Status: DC | PRN

## 2014-04-03 MED ORDER — OXYTOCIN 40 UNITS IN LACTATED RINGERS INFUSION - SIMPLE MED
1.0000 m[IU]/min | INTRAVENOUS | Status: DC
  Filled 2014-04-03: qty 1000

## 2014-04-03 MED ORDER — DIBUCAINE 1 % RE OINT
1.0000 "application " | TOPICAL_OINTMENT | RECTAL | Status: DC | PRN
  Filled 2014-04-03 (×2): qty 28

## 2014-04-03 MED ORDER — BENZOCAINE-MENTHOL 20-0.5 % EX AERO
1.0000 "application " | INHALATION_SPRAY | CUTANEOUS | Status: DC | PRN
  Filled 2014-04-03 (×2): qty 56

## 2014-04-03 MED ORDER — NIFEDIPINE ER 30 MG PO TB24
30.0000 mg | ORAL_TABLET | Freq: Every day | ORAL | Status: DC
  Filled 2014-04-03 (×2): qty 1

## 2014-04-03 MED ORDER — LACTATED RINGERS IV SOLN: 500.0000 mL | INTRAVENOUS | Status: DC | PRN

## 2014-04-03 MED ORDER — ONDANSETRON HCL 4 MG/2ML IJ SOLN: 4.0000 mg | Freq: Four times a day (QID) | INTRAMUSCULAR | Status: DC | PRN

## 2014-04-03 MED ORDER — DIPHENHYDRAMINE HCL 25 MG PO CAPS
25.0000 mg | ORAL_CAPSULE | Freq: Four times a day (QID) | ORAL | Status: DC | PRN
Start: 2014-04-03 — End: 2014-04-04

## 2014-04-03 MED ORDER — WITCH HAZEL-GLYCERIN EX PADS: 1.0000 "application " | MEDICATED_PAD | CUTANEOUS | Status: DC | PRN

## 2014-04-03 MED ORDER — TERBUTALINE SULFATE 1 MG/ML IJ SOLN: 0.2500 mg | Freq: Once | INTRAMUSCULAR | Status: DC | PRN

## 2014-04-03 MED ORDER — ONDANSETRON HCL 4 MG/2ML IJ SOLN: 4.0000 mg | INTRAMUSCULAR | Status: DC | PRN

## 2014-04-03 MED ORDER — NIFEDIPINE ER OSMOTIC RELEASE 30 MG PO TB24
30.0000 mg | ORAL_TABLET | Freq: Every day | ORAL | Status: DC
  Administered 2014-04-03: 30 mg via ORAL
  Filled 2014-04-03: qty 1

## 2014-04-03 NOTE — H&P (Signed)
Loretta Dixon is a 33 y.o. female presenting in labor. PNC complicated by chronic hTn on procardia with early SGA. GBS cx neg. Pt has hx PTB x 2 given 17OHP weekly . Last sono 12/2. 5lb 14 oz( AC @ 9%) nl fluid, nl dopplers  Maternal Medical History:  Reason for admission: Contractions.   Contractions: Onset was yesterday.   Frequency: irregular.   Perceived severity is moderate.    Fetal activity: Perceived fetal activity is normal.    Prenatal complications: PIH.     OB History    Gravida Para Term Preterm AB TAB SAB Ectopic Multiple Living   3 2 1 1      2      Past Medical History  Diagnosis Date  . Hypertension   . Anemia   . Candidiasis of mouth   . Vaginal Pap smear, abnormal   . Galactorrhea not associated with childbirth    Past Surgical History  Procedure Laterality Date  . Dilation and curettage of uterus  July 2013   Family History: family history includes Asthma in her maternal aunt and maternal grandmother; Hypertension in her father, maternal grandfather, maternal grandmother, and mother. Social History:  reports that she has never smoked. She has never used smokeless tobacco. She reports that she does not drink alcohol or use illicit drugs.   Prenatal Transfer Tool  Maternal Diabetes: No Genetic Screening: Declined Maternal Ultrasounds/Referrals: Normal Fetal Ultrasounds or other Referrals:  None Maternal Substance Abuse:  No Significant Maternal Medications:  Meds include: Other: procardia XL restarted in third trimester, bystolic and procardia in early preg Significant Maternal Lab Results:  Lab values include: Group B Strep negative Other Comments:  chronic HTN  Review of Systems  All other systems reviewed and are negative.   VE 4/80-90/+1 Temperature 98.7 F (37.1 C), temperature source Oral, height 5\' 3"  (1.6 m), weight 74.39 kg (164 lb). Maternal Exam:  Uterine Assessment: Contraction strength is moderate.  Contraction frequency is  irregular.   Abdomen: Patient reports no abdominal tenderness. Estimated fetal weight is 6lb.   Fetal presentation: vertex  Introitus: Normal vulva.   Physical Exam  Prenatal labs: ABO, Rh: A/Positive/-- (05/21 0000) Antibody: Negative (05/21 0000) Rubella: Immune (05/21 0000) RPR: Nonreactive (05/21 0000)  HBsAg: Negative (05/21 0000)  HIV: Non-reactive (05/21 0000)  GBS:   negative  Assessment/Plan: Labor Chronic HTN  IUP @38  1/7 wks P) admit routine labs pitocin augmentation. Declines pain med/amniotomy   Broderic Bara A 04/03/2014, 11:45 AM

## 2014-04-03 NOTE — Progress Notes (Signed)
Called for increased vaginal bleeding  VE 6/100/0 AROM cear fluid  IUPC/ISE placed  IMP: active phase P) Epidural

## 2014-04-03 NOTE — H&P (Signed)
S: notes painful ctx Somewhat uncomfortable  O: Pitocin BP 141/91 VE deferred ? Leaking (+) increased bloody show Tracing: baseline 150 (+)small variable Ctx q 2 mins  CBC    Component Value Date/Time   WBC 9.1 04/03/2014 1125   RBC 3.95 04/03/2014 1125   HGB 11.4* 04/03/2014 1125   HCT 34.5* 04/03/2014 1125   PLT 165 04/03/2014 1125   MCV 87.3 04/03/2014 1125   MCH 28.9 04/03/2014 1125   MCHC 33.0 04/03/2014 1125   RDW 13.7 04/03/2014 1125     IMP: Labor Chronic HTN  IUP @ 38 1/7 weeks P) cont present management. Cont procardia XL tonight

## 2014-04-04 LAB — CBC
HCT: 24.9 % — ABNORMAL LOW (ref 36.0–46.0)
Hemoglobin: 8.3 g/dL — ABNORMAL LOW (ref 12.0–15.0)
MCH: 29 pg (ref 26.0–34.0)
MCHC: 33.3 g/dL (ref 30.0–36.0)
MCV: 87.1 fL (ref 78.0–100.0)
Platelets: 154 10*3/uL (ref 150–400)
RBC: 2.86 MIL/uL — ABNORMAL LOW (ref 3.87–5.11)
RDW: 13.6 % (ref 11.5–15.5)
WBC: 10.5 10*3/uL (ref 4.0–10.5)

## 2014-04-04 MED ORDER — MAGNESIUM OXIDE -MG SUPPLEMENT 200 MG PO TABS: 200.0000 mg | ORAL_TABLET | Freq: Every day | ORAL | Status: DC

## 2014-04-04 MED ORDER — FERROUS SULFATE 325 (65 FE) MG PO TABS: 325.0000 mg | ORAL_TABLET | Freq: Two times a day (BID) | ORAL | Status: DC

## 2014-04-04 MED ORDER — IBUPROFEN 600 MG PO TABS: 600.0000 mg | ORAL_TABLET | Freq: Four times a day (QID) | ORAL | Status: DC

## 2014-04-04 NOTE — Progress Notes (Addendum)
Patient ID: Loretta Dixon, female   DOB: 1981/02/05, 33 y.o.   MRN: [id number] PPD # 1 SVD  S:  Reports feeling well and ready for early discharge             Tolerating po/ No nausea or vomiting             Bleeding is light             Pain controlled with ibuprofen (OTC)             Up ad lib / ambulatory / voiding without difficulties    Newborn  Information for the patient's newborn:  Loretta Dixon, Loretta Dixon [[id number]][[id number]]  female  breast feeding     O:  A & O x 3, in no apparent distress              VS:  Filed Vitals:   04/03/14 1845 04/03/14 2230 04/04/14 0045 04/04/14 0600  BP: 131/77 145/84 138/74 119/74  Pulse: 85 90 84 82  Temp: 98.1 F (36.7 C) 98 F (36.7 C)  98.6 F (37 C)  TempSrc: Oral Oral  Oral  Resp: 20 20  18   Height:      Weight:        LABS:  Recent Labs  04/03/14 1125 04/04/14 0545  WBC 9.1 10.5  HGB 11.4* 8.3*  HCT 34.5* 24.9*  PLT 165 154    Blood type: A POS (12/14 1125)  Rubella: Immune (05/21 0000)   I&O: I/O last 3 completed shifts: In: -  Out: 250 [Blood:250]             Lungs: Clear and unlabored  Heart: regular rate and rhythm / no murmurs  Abdomen: soft, non-tender, non-distended              Fundus: firm, non-tender, U-1  Perineum: intact, no edema  Lochia: minimal  Extremities: no edema, no calf pain or tenderness, no Homans    A/P: PPD # 1  33 y.o., [id number]   Principal Problem:    Postpartum care following vaginal delivery (12/14)  Active Problems:    Normal labor    Chronic hypertension complicating or reason for care during pregnancy  ABL Anemia   Doing well - stable status   Routine post partum orders  Start magnesium oxide 200 mg daily  Continue FeSO4 BID  Continue Procardia 30 mg daily  Early discharge home  Raelyn MoraDAWSON, Jerrid Forgette, M, MSN, CNM 04/04/2014, 10:20 AM

## 2014-04-04 NOTE — Lactation Note (Signed)
This note was copied from the chart of Loretta Vira BlancoShimica Hommel. Lactation Consultation Note; Initial visit with this experienced BF mom. She reports baby is nursing well but left nipple has small blister at top of nipple. Mom reports some pain at the beginning of the feeding that eases off. Reviewed keeping the baby close to the breast throughout the feeding. Reviewed hand expression with mom and she easily obtained a few drops of Colostrum- rubbed into nipples. Comfort gels given with instructions for use. BF brochure given with resources for support after DC. No questions at present. To call prn  Patient Name: Loretta Dixon ZOXWR'UToday's Date: 04/04/2014 Reason for consult: Initial assessment   Maternal Data Formula Feeding for Exclusion: No Has patient been taught Hand Expression?: Yes Does the patient have breastfeeding experience prior to this delivery?: Yes  Feeding Feeding Type: Breast Fed Length of feed: 20 min  LATCH Score/Interventions                      Lactation Tools Discussed/Used     Consult Status Consult Status: Follow-up Date: 04/05/14 Follow-up type: In-patient    Pamelia HoitWeeks, Jeremy Mclamb D 04/04/2014, 1:55 PM

## 2014-04-04 NOTE — Discharge Instructions (Signed)
Breast Pumping Tips °If you are breastfeeding, there may be times when you cannot feed your baby directly. Returning to work or going on a trip are common examples. Pumping allows you to store breast milk and feed it to your baby later.  °You may not get much milk when you first start to pump. Your breasts should start to make more after a few days. If you pump at the times you usually feed your baby, you may be able to keep making enough milk to feed your baby without also using formula. The more often you pump, the more milk you will produce.  °WHEN SHOULD I PUMP?  °· You can begin to pump soon after delivery. However, some experts recommend waiting about 4 weeks before giving your infant a bottle to make sure breastfeeding is going well.  °· If you plan to return to work, begin pumping a few weeks before. This will help you develop techniques that work best for you. It also lets you build up a supply of breast milk.   °· When you are with your infant, feed on demand and pump after each feeding.   °· When you are away from your infant for several hours, pump for about 15 minutes every 2-3 hours. Pump both breasts at the same time if you can.   °· If your infant has a formula feeding, make sure to pump around the same time.     °· If you drink any alcohol, wait 2 hours before pumping.   °HOW DO I PREPARE TO PUMP? °Your let-down reflex is the natural reaction to stimulation that makes your breast milk flow. It is easier to stimulate this reflex when you are relaxed. Find relaxation techniques that work for you. If you have difficulty with your let-down reflex, try these methods:  °· Smell one of your infant's blankets or an item of clothing.   °· Look at a picture or video of your infant.   °· Sit in a quiet, private space.   °· Massage the breast you plan to pump.   °· Place soothing warmth on the breast.   °· Play relaxing music.   °WHAT ARE SOME GENERAL BREAST PUMPING TIPS? °· Wash your hands before you pump. You  do not need to wash your nipples or breasts. °· There are three ways to pump. °¨ You can use your hand to massage and compress your breast. °¨ You can use a handheld manual pump. °¨ You can use an electric pump.   °· Make sure the suction cup (flange) on the breast pump is the right size. Place the flange directly over the nipple. If it is the wrong size or placed the wrong way, it may be painful and cause nipple damage.   °· If pumping is uncomfortable, apply a small amount of purified or modified lanolin to your nipple and areola. °· If you are using an electric pump, adjust the speed and suction power to be more comfortable. °· If pumping is painful or if you are not getting very much milk, you may need a different type of pump. A lactation consultant can help you determine what type of pump to use.   °· Keep a full water bottle near you at all times. Drinking lots of fluid helps you make more milk.  °· You can store your milk to use later. Pumped breast milk can be stored in a sealable, sterile container or plastic bag. Label all stored breast milk with the date you pumped it. °¨ Milk can stay out at room temperature for up to 8 hours. °¨   You can store your milk in the refrigerator for up to 8 days. °¨ You can store your milk in the freezer for 3 months. Thaw frozen milk using warm water. Do not put it in the microwave. °· Do not smoke. Smoking can lower your milk supply and harm your infant. If you need help quitting, ask your health care provider to recommend a program.   °WHEN SHOULD I CALL MY HEALTH CARE PROVIDER OR A LACTATION CONSULTANT? °· You are having trouble pumping. °· You are concerned that you are not making enough milk. °· You have nipple pain, soreness, or redness. °· You want to use birth control. Birth control pills may lower your milk supply. Talk to your health care provider about your options. °Document Released: 09/25/2009 Document Revised: 04/12/2013 Document Reviewed:  01/28/2013 °Exit[employer]® Patient Information ©2015 [employer]. This information is not intended to replace advice given to you by your health care provider. Make sure you discuss any questions you have with your health care provider. ° °Nutrition for the New Mother  °A new mother needs good health and nutrition so she can have energy to take care of a new baby. Whether a mother breastfeeds or formula feeds the baby, it is important to have a well-balanced diet. Foods from all the food groups should be chosen to meet the new mother's energy needs and to give her the nutrients needed for repair and healing.  °A HEALTHY EATING PLAN °The My [employer] plan for Moms outlines what you should eat to help you and your baby stay healthy. The energy and amount of food you need depends on whether or not you are breastfeeding. If you are breastfeeding you will need more nutrients. If you choose not to breastfeed, your nutrition goal should be to return to a healthy weight. Limiting calories may be needed if you are not breastfeeding.  °HOME CARE INSTRUCTIONS  °· For a personal plan based on your unique needs, see your Registered Dietitian or visit www.mypyramid.gov. °· Eat a variety of foods. The plan below will help guide you. The following chart has a suggested daily meal plan from the [employer] for Moms. °· Eat a variety of fruits and vegetables. °· Eat more dark green and orange vegetables and cooked dried beans. °· Make half your grains whole grains. Choose whole instead of refined grains. °· Choose low-fat or lean meats and poultry. °· Choose low-fat or fat-free dairy products like milk, cheese, or yogurt. °Fruits °· Breastfeeding: 2 cups °· Non-Breastfeeding: 2 cups °· What Counts as a serving? °¨ 1 cup of fruit or juice. °¨ ½ cup dried fruit. °Vegetables °· Breastfeeding: 3 cups °· Non-Breastfeeding: 2 ½ cups °· What Counts as a serving? °¨ 1 cup raw or cooked vegetables. °¨ Juice or 2 cups raw leafy  vegetables. °Grains °· Breastfeeding: 8 oz °· Non-Breastfeeding: 6 oz °· What Counts as a serving? °¨ 1 slice bread. °¨ 1 oz ready-to-eat cereal. °¨ ½ cup cooked pasta, rice, or cereal. °Meat and Beans °· Breastfeeding: 6 ½ oz °· Non-Breastfeeding: 5 ½ oz °· What Counts as a serving? °¨ 1 oz lean meat, poultry, or fish °¨ ¼ cup cooked dry beans °¨ ½ oz nuts or 1 egg °¨ 1 tbs peanut butter °Milk °· Breastfeeding: 3 cups °· Non-Breastfeeding: 3 cups °· What Counts as a serving? °¨ 1 cup milk. °¨ 8 oz yogurt. °¨ 1 ½ oz cheese. °¨ 2 oz processed cheese. °TIPS FOR THE BREASTFEEDING MOM °· Rapid weight   loss is not suggested when you are breastfeeding. By simply breastfeeding, you will be able to lose the weight gained during your pregnancy. Your caregiver can keep track of your weight and tell you if your weight loss is appropriate. °· Be sure to drink fluids. You may notice that you are thirstier than usual. A suggestion is to drink a glass of water or other beverage whenever you breastfeed. °· Avoid alcohol as it can be passed into your breast milk. °· Limit caffeine drinks to no more than 2 to 3 cups per day. °· You may need to keep taking your prenatal vitamin while you are breastfeeding. Talk with your caregiver about taking a vitamin or supplement. °RETURING TO A HEALTHY WEIGHT °· The My [employer] Plan for Moms will help you return to a healthy weight. It will also provide the nutrients you need. °· You may need to limit "empty" calories. These include: °¨ High fat foods like fried foods, fatty meats, fast food, butter, and mayonnaise. °¨ High sugar foods like sodas, jelly, candy, and sweets. °· Be physically active. Include 30 minutes of exercise or more each day. Choose an activity you like such as walking, swimming, biking, or aerobics. Check with your caregiver before you start to exercise. °Document Released: 07/15/2007 Document Revised: 06/30/2011 Document Reviewed: 07/15/2007 °ExitCare® Patient Information  ©2015 [employer]. This information is not intended to replace advice given to you by your health care provider. Make sure you discuss any questions you have with your health care provider. °Postpartum Depression and Baby Blues °The postpartum period begins right after the birth of a baby. During this time, there is often a great amount of joy and excitement. It is also a time of many changes in the life of the parents. Regardless of how many times a mother gives birth, each child brings new challenges and dynamics to the family. It is not unusual to have feelings of excitement along with confusing shifts in moods, emotions, and thoughts. All mothers are at risk of developing postpartum depression or the "baby blues." These mood changes can occur right after giving birth, or they may occur many months after giving birth. The baby blues or postpartum depression can be mild or severe. Additionally, postpartum depression can go away rather quickly, or it can be a long-term condition.  °CAUSES °Raised hormone levels and the rapid drop in those levels are thought to be a main cause of postpartum depression and the baby blues. A number of hormones change during and after pregnancy. Estrogen and progesterone usually decrease right after the delivery of your baby. The levels of thyroid hormone and various cortisol steroids also rapidly drop. Other factors that play a role in these mood changes include major life events and genetics.  °RISK FACTORS °If you have any of the following risks for the baby blues or postpartum depression, know what symptoms to watch out for during the postpartum period. Risk factors that may increase the likelihood of getting the baby blues or postpartum depression include: °· Having a personal or family history of depression.   °· Having depression while being pregnant.   °· Having premenstrual mood issues or mood issues related to oral contraceptives. °· Having a lot of life stress.   °· Having  marital conflict.   °· Lacking a social support network.   °· Having a baby with special needs.   °· Having health problems, such as diabetes.   °SIGNS AND SYMPTOMS °Symptoms of baby blues include: °· Brief changes in mood, such as going   from extreme happiness to sadness. °· Decreased concentration.   °· Difficulty sleeping.   °· Crying spells, tearfulness.   °· Irritability.   °· Anxiety.   °Symptoms of postpartum depression typically begin within the first month after giving birth. These symptoms include: °· Difficulty sleeping or excessive sleepiness.   °· Marked weight loss.   °· Agitation.   °· Feelings of worthlessness.   °· Lack of interest in activity or food.   °Postpartum psychosis is a very serious condition and can be dangerous. Fortunately, it is rare. Displaying any of the following symptoms is cause for immediate medical attention. Symptoms of postpartum psychosis include:  °· Hallucinations and delusions.   °· Bizarre or disorganized behavior.   °· Confusion or disorientation.   °DIAGNOSIS  °A diagnosis is made by an evaluation of your symptoms. There are no medical or lab tests that lead to a diagnosis, but there are various questionnaires that a health care provider may use to identify those with the baby blues, postpartum depression, or psychosis. Often, a screening tool called the Edinburgh Postnatal Depression Scale is used to diagnose depression in the postpartum period.  °TREATMENT °The baby blues usually goes away on its own in 1-2 weeks. Social support is often all that is needed. You will be encouraged to get adequate sleep and rest. Occasionally, you may be given medicines to help you sleep.  °Postpartum depression requires treatment because it can last several months or longer if it is not treated. Treatment may include individual or group therapy, medicine, or both to address any social, physiological, and psychological factors that may play a role in the depression. Regular exercise, a  healthy diet, rest, and social support may also be strongly recommended.  °Postpartum psychosis is more serious and needs treatment right away. Hospitalization is often needed. °HOME CARE INSTRUCTIONS °· Get as much rest as you can. Nap when the baby sleeps.   °· Exercise regularly. Some women find yoga and walking to be beneficial.   °· Eat a balanced and nourishing diet.   °· Do little things that you enjoy. Have a cup of tea, take a bubble bath, read your favorite magazine, or listen to your favorite music. °· Avoid alcohol.   °· Ask for help with household chores, cooking, grocery shopping, or running errands as needed. Do not try to do everything.   °· Talk to people close to you about how you are feeling. Get support from your partner, family members, friends, or other new moms. °· Try to stay positive in how you think. Think about the things you are grateful for.   °· Do not spend a lot of time alone.   °· Only take over-the-counter or prescription medicine as directed by your health care provider. °· Keep all your postpartum appointments.   °· Let your health care provider know if you have any concerns.   °SEEK MEDICAL CARE IF: °You are having a reaction to or problems with your medicine. °SEEK IMMEDIATE MEDICAL CARE IF: °· You have suicidal feelings.   °· You think you may harm the baby or someone else. °MAKE SURE YOU: °· Understand these instructions. °· Will watch your condition. °· Will get help right away if you are not doing well or get worse. °Document Released: 01/10/2004 Document Revised: 04/12/2013 Document Reviewed: 01/17/2013 °Exit[employer]® Patient Information ©2015 [employer]. This information is not intended to replace advice given to you by your health care provider. Make sure you discuss any questions you have with your health care provider. °Breastfeeding and Mastitis °Mastitis is inflammation of the breast tissue. It can occur in women who   are breastfeeding. This can make breastfeeding  painful. Mastitis will sometimes go away on its own. Your health care provider will help determine if treatment is needed. °CAUSES °Mastitis is often associated with a blocked milk (lactiferous) duct. This can happen when too much milk builds up in the breast. Causes of excess milk in the breast can include: °· Poor latch-on. If your baby is not latched onto the breast properly, she or he may not empty your breast completely while breastfeeding. °· Allowing too much time to pass between feedings. °· Wearing a bra or other clothing that is too tight. This puts extra pressure on the lactiferous ducts so milk does not flow through them as it should. °Mastitis can also be caused by a bacterial infection. Bacteria may enter the breast tissue through cuts or openings in the skin. In women who are breastfeeding, this may occur because of cracked or irritated skin. Cracks in the skin are often caused when your baby does not latch on properly to the breast. °SIGNS AND SYMPTOMS °· Swelling, redness, tenderness, and pain in an area of the breast. °· Swelling of the glands under the arm on the same side. °· Fever may or may not accompany mastitis. °If an infection is allowed to progress, a collection of pus (abscess) may develop. °DIAGNOSIS  °Your health care provider can usually diagnose mastitis based on your symptoms and a physical exam. Tests may be done to help confirm the diagnosis. These may include: °· Removal of pus from the breast by applying pressure to the area. This pus can be examined in the lab to determine which bacteria are present. If an abscess has developed, the fluid in the abscess can be removed with a needle. This can also be used to confirm the diagnosis and determine the bacteria present. In most cases, pus will not be present. °· Blood tests to determine if your body is fighting a bacterial infection. °· Mammogram or ultrasound tests to rule out other problems or diseases. °TREATMENT  °Mastitis that  occurs with breastfeeding will sometimes go away on its own. Your health care provider may choose to wait 24 hours after first seeing you to decide whether a prescription medicine is needed. If your symptoms are worse after 24 hours, your health care provider will likely prescribe an antibiotic medicine to treat the mastitis. He or she will determine which bacteria are most likely causing the infection and will then select an appropriate antibiotic medicine. This is sometimes changed based on the results of tests performed to identify the bacteria, or if there is no response to the antibiotic medicine selected. Antibiotic medicines are usually given by mouth. You may also be given medicine for pain. °HOME CARE INSTRUCTIONS °· Only take over-the-counter or prescription medicines for pain, fever, or discomfort as directed by your health care provider. °· If your health care provider prescribed an antibiotic medicine, take the medicine as directed. Make sure you finish it even if you start to feel better. °· Do not wear a tight or underwire bra. Wear a soft, supportive bra. °· Increase your fluid intake, especially if you have a fever. °· Continue to empty the breast. Your health care provider can tell you whether this milk is safe for your infant or needs to be thrown out. You may be told to stop nursing until your health care provider thinks it is safe for your baby. Use a breast pump if you are advised to stop nursing. °· Keep your nipples   clean and dry. °· Empty the first breast completely before going to the other breast. If your baby is not emptying your breasts completely for some reason, use a breast pump to empty your breasts. °· If you go back to work, pump your breasts while at work to stay in time with your nursing schedule. °· Avoid allowing your breasts to become overly filled with milk (engorged). °SEEK MEDICAL CARE IF: °· You have pus-like discharge from the breast. °· Your symptoms do not improve with  the treatment prescribed by your health care provider within 2 days. °SEEK IMMEDIATE MEDICAL CARE IF: °· Your pain and swelling are getting worse. °· You have pain that is not controlled with medicine. °· You have a red line extending from the breast toward your armpit. °· You have a fever or persistent symptoms for more than 2-3 days. °· You have a fever and your symptoms suddenly get worse. °MAKE SURE YOU:  °· Understand these instructions. °· Will watch your condition. °· Will get help right away if you are not doing well or get worse. °Document Released: 08/02/2004 Document Revised: 04/12/2013 Document Reviewed: 11/11/2012 °[employer]® Patient Information ©2015 [employer]. This information is not intended to replace advice given to you by your health care provider. Make sure you discuss any questions you have with your health care provider. °Breastfeeding °Deciding to breastfeed is one of the best choices you can make for you and your baby. A change in hormones during pregnancy causes your breast tissue to grow and increases the number and size of your milk ducts. These hormones also allow proteins, sugars, and fats from your blood supply to make breast milk in your milk-producing glands. Hormones prevent breast milk from being released before your baby is born as well as prompt milk flow after birth. Once breastfeeding has begun, thoughts of your baby, as well as his or her sucking or crying, can stimulate the release of milk from your milk-producing glands.  °BENEFITS OF BREASTFEEDING °For Your Baby °· Your first milk (colostrum) helps your baby's digestive system function better.   °· There are antibodies in your milk that help your baby fight off infections.   °· Your baby has a lower incidence of asthma, allergies, and sudden infant death syndrome.   °· The nutrients in breast milk are better for your baby than infant formulas and are designed uniquely for your baby's needs.   °· Breast milk improves your  baby's brain development.   °· Your baby is less likely to develop other conditions, such as childhood obesity, asthma, or type 2 diabetes mellitus.   °For You  °· Breastfeeding helps to create a very special bond between you and your baby.   °· Breastfeeding is convenient. Breast milk is always available at the correct temperature and costs nothing.   °· Breastfeeding helps to burn calories and helps you lose the weight gained during pregnancy.   °· Breastfeeding makes your uterus contract to its prepregnancy size faster and slows bleeding (lochia) after you give birth.   °· Breastfeeding helps to lower your risk of developing type 2 diabetes mellitus, osteoporosis, and breast or ovarian cancer later in life. °SIGNS THAT YOUR BABY IS HUNGRY °Early Signs of Hunger  °· Increased alertness or activity. °· Stretching. °· Movement of the head from side to side. °· Movement of the head and opening of the mouth when the corner of the mouth or cheek is stroked (rooting). °· Increased sucking sounds, smacking lips, cooing, sighing, or squeaking. °· Hand-to-mouth movements. °· Increased sucking of   fingers or hands. °Late Signs of Hunger °· Fussing. °· Intermittent crying. °Extreme Signs of Hunger °Signs of extreme hunger will require calming and consoling before your baby will be able to breastfeed successfully. Do not wait for the following signs of extreme hunger to occur before you initiate breastfeeding:   °· Restlessness. °· A loud, strong cry. °·  Screaming. °BREASTFEEDING BASICS °Breastfeeding Initiation °· Find a comfortable place to sit or lie down, with your neck and back well supported. °· Place a pillow or rolled up blanket under your baby to bring him or her to the level of your breast (if you are seated). Nursing pillows are specially designed to help support your arms and your baby while you breastfeed. °· Make sure that your baby's abdomen is facing your abdomen.   °· Gently massage your breast. With your  fingertips, massage from your chest wall toward your nipple in a circular motion. This encourages milk flow. You may need to continue this action during the feeding if your milk flows slowly. °· Support your breast with 4 fingers underneath and your thumb above your nipple. Make sure your fingers are well away from your nipple and your baby's mouth.   °· Stroke your baby's lips gently with your finger or nipple.   °· When your baby's mouth is open wide enough, quickly bring your baby to your breast, placing your entire nipple and as much of the colored area around your nipple (areola) as possible into your baby's mouth.   °¨ More areola should be visible above your baby's upper lip than below the lower lip.   °¨ Your baby's tongue should be between his or her lower gum and your breast.   °· Ensure that your baby's mouth is correctly positioned around your nipple (latched). Your baby's lips should create a seal on your breast and be turned out (everted). °· It is common for your baby to suck about 2-3 minutes in order to start the flow of breast milk. °Latching °Teaching your baby how to latch on to your breast properly is very important. An improper latch can cause nipple pain and decreased milk supply for you and poor weight gain in your baby. Also, if your baby is not latched onto your nipple properly, he or she may swallow some air during feeding. This can make your baby fussy. Burping your baby when you switch breasts during the feeding can help to get rid of the air. However, teaching your baby to latch on properly is still the best way to prevent fussiness from swallowing air while breastfeeding. °Signs that your baby has successfully latched on to your nipple:    °· Silent tugging or silent sucking, without causing you pain.   °· Swallowing heard between every 3-4 sucks.   °·  Muscle movement above and in front of his or her ears while sucking.   °Signs that your baby has not successfully latched on to  nipple:  °· Sucking sounds or smacking sounds from your baby while breastfeeding. °· Nipple pain. °If you think your baby has not latched on correctly, slip your finger into the corner of your baby's mouth to break the suction and place it between your baby's gums. Attempt breastfeeding initiation again. °Signs of Successful Breastfeeding °Signs from your baby:   °· A gradual decrease in the number of sucks or complete cessation of sucking.   °· Falling asleep.   °· Relaxation of his or her body.   °· Retention of a small amount of milk in his or her mouth.   °· Letting go   of your breast by himself or herself. °Signs from you: °· Breasts that have increased in firmness, weight, and size 1-3 hours after feeding.   °· Breasts that are softer immediately after breastfeeding. °· Increased milk volume, as well as a change in milk consistency and color by the fifth day of breastfeeding.   °· Nipples that are not sore, cracked, or bleeding. °Signs That Your Baby is Getting Enough Milk °· Wetting at least 3 diapers in a 24-hour period. The urine should be clear and pale yellow by age 5 days. °· At least 3 stools in a 24-hour period by age 5 days. The stool should be soft and yellow. °· At least 3 stools in a 24-hour period by age 7 days. The stool should be seedy and yellow. °· No loss of weight greater than 10% of birth weight during the first 3 days of age. °· Average weight gain of 4-7 ounces (113-198 g) per week after age 4 days. °· Consistent daily weight gain by age 5 days, without weight loss after the age of 2 weeks. °After a feeding, your baby may spit up a small amount. This is common. °BREASTFEEDING FREQUENCY AND DURATION °Frequent feeding will help you make more milk and can prevent sore nipples and breast engorgement. Breastfeed when you feel the need to reduce the fullness of your breasts or when your baby shows signs of hunger. This is called "breastfeeding on demand." Avoid introducing a pacifier to your  baby while you are working to establish breastfeeding (the first 4-6 weeks after your baby is born). After this time you may choose to use a pacifier. Research has shown that pacifier use during the first year of a baby's life decreases the risk of sudden infant death syndrome (SIDS). °Allow your baby to feed on each breast as long as he or she wants. Breastfeed until your baby is finished feeding. When your baby unlatches or falls asleep while feeding from the first breast, offer the second breast. Because newborns are often sleepy in the first few weeks of life, you may need to awaken your baby to get him or her to feed. °Breastfeeding times will vary from baby to baby. However, the following rules can serve as a guide to help you ensure that your baby is properly fed: °· Newborns (babies 4 weeks of age or younger) may breastfeed every 1-3 hours. °· Newborns should not go longer than 3 hours during the day or 5 hours during the night without breastfeeding. °· You should breastfeed your baby a minimum of 8 times in a 24-hour period until you begin to introduce solid foods to your baby at around 6 months of age. °BREAST MILK PUMPING °Pumping and storing breast milk allows you to ensure that your baby is exclusively fed your breast milk, even at times when you are unable to breastfeed. This is especially important if you are going back to work while you are still breastfeeding or when you are not able to be present during feedings. Your lactation consultant can give you guidelines on how long it is safe to store breast milk.  °A breast pump is a machine that allows you to pump milk from your breast into a sterile bottle. The pumped breast milk can then be stored in a refrigerator or freezer. Some breast pumps are operated by hand, while others use electricity. Ask your lactation consultant which type will work best for you. Breast pumps can be purchased, but some hospitals and breastfeeding support groups   lease  breast pumps on a monthly basis. A lactation consultant can teach you how to hand express breast milk, if you prefer not to use a pump.  °CARING FOR YOUR BREASTS WHILE YOU BREASTFEED °Nipples can become dry, cracked, and sore while breastfeeding. The following recommendations can help keep your breasts moisturized and healthy: °· Avoid using soap on your nipples.   °· Wear a supportive bra. Although not required, special nursing bras and tank tops are designed to allow access to your breasts for breastfeeding without taking off your entire bra or top. Avoid wearing underwire-style bras or extremely tight bras. °· Air dry your nipples for 3-4 minutes after each feeding.   °· Use only cotton bra pads to absorb leaked breast milk. Leaking of breast milk between feedings is normal.   °· Use lanolin on your nipples after breastfeeding. Lanolin helps to maintain your skin's normal moisture barrier. If you use pure lanolin, you do not need to wash it off before feeding your baby again. Pure lanolin is not toxic to your baby. You may also hand express a few drops of breast milk and gently massage that milk into your nipples and allow the milk to air dry. °In the first few weeks after giving birth, some women experience extremely full breasts (engorgement). Engorgement can make your breasts feel heavy, warm, and tender to the touch. Engorgement peaks within 3-5 days after you give birth. The following recommendations can help ease engorgement: °· Completely empty your breasts while breastfeeding or pumping. You may want to start by applying warm, moist heat (in the shower or with warm water-soaked hand towels) just before feeding or pumping. This increases circulation and helps the milk flow. If your baby does not completely empty your breasts while breastfeeding, pump any extra milk after he or she is finished. °· Wear a snug bra (nursing or regular) or tank top for 1-2 days to signal your body to slightly decrease milk  production. °· Apply ice packs to your breasts, unless this is too uncomfortable for you. °· Make sure that your baby is latched on and positioned properly while breastfeeding. °If engorgement persists after 48 hours of following these recommendations, contact your health care provider or a lactation consultant. °OVERALL HEALTH CARE RECOMMENDATIONS WHILE BREASTFEEDING °· Eat healthy foods. Alternate between meals and snacks, eating 3 of each per day. Because what you eat affects your breast milk, some of the foods may make your baby more irritable than usual. Avoid eating these foods if you are sure that they are negatively affecting your baby. °· Drink milk, fruit juice, and water to satisfy your thirst (about 10 glasses a day).   °· Rest often, relax, and continue to take your prenatal vitamins to prevent fatigue, stress, and anemia. °· Continue breast self-awareness checks. °· Avoid chewing and smoking tobacco. °· Avoid alcohol and drug use. °Some medicines that may be harmful to your baby can pass through breast milk. It is important to ask your health care provider before taking any medicine, including all over-the-counter and prescription medicine as well as vitamin and herbal supplements. °It is possible to become pregnant while breastfeeding. If birth control is desired, ask your health care provider about options that will be safe for your baby. °SEEK MEDICAL CARE IF:  °· You feel like you want to stop breastfeeding or have become frustrated with breastfeeding. °· You have painful breasts or nipples. °· Your nipples are cracked or bleeding. °· Your breasts are red, tender, or warm. °· You have   a swollen area on either breast. °· You have a fever or chills. °· You have nausea or vomiting. °· You have drainage other than breast milk from your nipples. °· Your breasts do not become full before feedings by the fifth day after you give birth. °· You feel sad and depressed. °· Your baby is too sleepy to eat  well. °· Your baby is having trouble sleeping.   °· Your baby is wetting less than 3 diapers in a 24-hour period. °· Your baby has less than 3 stools in a 24-hour period. °· Your baby's skin or the white part of his or her eyes becomes yellow.   °· Your baby is not gaining weight by 5 days of age. °SEEK IMMEDIATE MEDICAL CARE IF:  °· Your baby is overly tired (lethargic) and does not want to wake up and feed. °· Your baby develops an unexplained fever. °Document Released: 04/07/2005 Document Revised: 04/12/2013 Document Reviewed: 09/29/2012 °[employer]® Patient Information ©2015 [employer]. This information is not intended to replace advice given to you by your health care provider. Make sure you discuss any questions you have with your health care provider. ° °

## 2014-04-04 NOTE — Discharge Summary (Signed)
Obstetric Discharge Summary  Reason for Admission: Pt is a G3P2103 at 4850w1d in active labor.  [facility] since 8.4 wks, with Dr. Cherly Hensenousins as primary provider.  Medications on Admission: Prescriptions prior to admission  Medication Sig Dispense Refill Last Dose  . aspirin 81 MG tablet Take 81 mg by mouth daily.   04/02/2014 at Unknown time  . folic acid (FOLVITE) 1 MG tablet Take 1 tablet by mouth daily.   04/02/2014 at Unknown time  . NIFEdipine (PROCARDIA-XL/ADALAT-CC/NIFEDICAL-XL) 30 MG 24 hr tablet Take 30 mg by mouth daily.   04/02/2014 at Unknown time  . Prenatal Vit-Fe Fumarate-FA (PRENATAL MULTIVITAMIN) TABS tablet Take 1 tablet by mouth daily at 12 noon.   04/02/2014 at Unknown time  . nebivolol (BYSTOLIC) 10 MG tablet Take 1 tablet (10 mg total) by mouth daily. (Patient not taking: Reported on 03/22/2014) 60 tablet 11     Prenatal Labs: ABO, Rh: A POS (12/14 1125)  Antibody: NEG (12/14 1125) Rubella: Immune (05/21 0000)  RPR: NON REAC (12/14 1125)  HBsAg: Negative (05/21 0000)  HIV: Non-reactive (05/21 0000)  GTT : Normal GBS:  Negative   Prenatal Procedures: NST and ultrasound Intrapartum Course: Admitted in active  labor . (+)CHTN / pitocin IVF and AROM for augmentation/ clear fluid / rapid progression to complete dilation / SVD of viable female over intact perineum by Dr. Cherly Hensenousins Intrapartum Procedures: spontaneous vaginal delivery Postpartum Procedures: none Complications-Operative and Postpartum: none  Labs: HEMOGLOBIN  Date Value Ref Range Status  04/04/2014 8.3* 12.0 - 15.0 g/dL Final    Comment:    DELTA CHECK NOTED REPEATED TO VERIFY    HCT  Date Value Ref Range Status  04/04/2014 24.9* 36.0 - 46.0 % Final   Lab Results  Component Value Date   PLT 154 04/04/2014    Newborn Data: Live born female on 04/03/2014 Birth Weight: 5 lb 13 oz (2637 g) APGAR: 9, 10  Home with mother.   Discharge Information: Date:  04/04/2014 Discharge Diagnoses:  Pt is a [id number] at 7250w1d S/P Term Pregnancy-delivered on 04/03/2014  Condition: stable Activity: pelvic rest Diet: routine, no added salt Medications:    Medication List    STOP taking these medications        nebivolol 10 MG tablet  Commonly known as:  BYSTOLIC      TAKE these medications        aspirin 81 MG tablet  Take 81 mg by mouth daily.     ferrous sulfate 325 (65 FE) MG tablet  Take 1 tablet (325 mg total) by mouth 2 (two) times daily with a meal.     folic acid 1 MG tablet  Commonly known as:  FOLVITE  Take 1 tablet by mouth daily.     ibuprofen 600 MG tablet  Commonly known as:  ADVIL,MOTRIN  Take 1 tablet (600 mg total) by mouth every 6 (six) hours.     NIFEdipine 30 MG 24 hr tablet  Commonly known as:  PROCARDIA-XL/ADALAT-CC/NIFEDICAL-XL  Take 30 mg by mouth daily.     prenatal multivitamin Tabs tablet  Take 1 tablet by mouth daily at 12 noon.       Instructions: [facility] instruction booklet has been given and reviewed Discharge to: home     Follow-up Information    Follow up with Cyan Clippinger A, MD. Schedule an appointment as soon as possible for a visit in 6 weeks.   Specialty:  Obstetrics and Gynecology  Why:  postpartum visit   Contact information:   [address] [place] [id number] [phone]       Raelyn MoraAWSON, ROLITTA, Judie PetitM, MSN, CNM 04/04/2014, 10:51 AM

## 2014-04-09 ENCOUNTER — Inpatient Hospital Stay (HOSPITAL_COMMUNITY): Admission: RE | Admit: 2014-04-09 | Payer: Managed Care, Other (non HMO) | Source: Ambulatory Visit

## 2015-09-04 ENCOUNTER — Other Ambulatory Visit: Payer: Self-pay | Admitting: Gastroenterology

## 2015-09-04 DIAGNOSIS — R1013 Epigastric pain: Secondary | ICD-10-CM

## 2015-09-13 ENCOUNTER — Other Ambulatory Visit: Payer: Managed Care, Other (non HMO)

## 2015-09-24 ENCOUNTER — Ambulatory Visit
Admission: RE | Admit: 2015-09-24 | Discharge: 2015-09-24 | Disposition: A | Discharge status: Home / Self Care | Payer: Managed Care, Other (non HMO) | Source: Ambulatory Visit | Attending: Gastroenterology | Admitting: Gastroenterology

## 2015-09-24 DIAGNOSIS — R1013 Epigastric pain: Secondary | ICD-10-CM

## 2016-05-05 ENCOUNTER — Other Ambulatory Visit: Payer: Self-pay | Admitting: Occupational Medicine

## 2016-05-05 ENCOUNTER — Ambulatory Visit: Payer: Self-pay

## 2016-05-05 ENCOUNTER — Ambulatory Visit (HOSPITAL_COMMUNITY)
Admission: EM | Admit: 2016-05-05 | Discharge: 2016-05-05 | Disposition: A | Discharge status: Other Acute Inpatient Hospital | Payer: Managed Care, Other (non HMO)

## 2016-05-05 DIAGNOSIS — M79674 Pain in right toe(s): Secondary | ICD-10-CM

## 2017-01-13 ENCOUNTER — Other Ambulatory Visit: Payer: Self-pay | Admitting: Physician Assistant

## 2017-01-13 DIAGNOSIS — Z8249 Family history of ischemic heart disease and other diseases of the circulatory system: Secondary | ICD-10-CM

## 2017-01-23 ENCOUNTER — Ambulatory Visit
Admission: RE | Admit: 2017-01-23 | Discharge: 2017-01-23 | Disposition: A | Discharge status: Home / Self Care | Payer: Managed Care, Other (non HMO) | Source: Ambulatory Visit | Attending: Physician Assistant | Admitting: Physician Assistant

## 2017-01-23 DIAGNOSIS — Z8249 Family history of ischemic heart disease and other diseases of the circulatory system: Secondary | ICD-10-CM

## 2018-01-30 ENCOUNTER — Other Ambulatory Visit: Payer: Self-pay

## 2018-01-30 ENCOUNTER — Emergency Department (HOSPITAL_COMMUNITY)
Admission: EM | Admit: 2018-01-30 | Discharge: 2018-01-30 | Disposition: A | Discharge status: Home / Self Care | Payer: Managed Care, Other (non HMO) | Attending: Emergency Medicine | Admitting: Emergency Medicine

## 2018-01-30 ENCOUNTER — Emergency Department (HOSPITAL_COMMUNITY): Payer: Managed Care, Other (non HMO)

## 2018-01-30 ENCOUNTER — Encounter (HOSPITAL_COMMUNITY): Payer: Self-pay

## 2018-01-30 DIAGNOSIS — R1013 Epigastric pain: Secondary | ICD-10-CM | POA: Diagnosis not present

## 2018-01-30 DIAGNOSIS — Z79899 Other long term (current) drug therapy: Secondary | ICD-10-CM | POA: Diagnosis not present

## 2018-01-30 DIAGNOSIS — Z3A10 10 weeks gestation of pregnancy: Secondary | ICD-10-CM | POA: Diagnosis not present

## 2018-01-30 DIAGNOSIS — O26891 Other specified pregnancy related conditions, first trimester: Secondary | ICD-10-CM | POA: Diagnosis not present

## 2018-01-30 LAB — URINALYSIS, ROUTINE W REFLEX MICROSCOPIC
Bacteria, UA: NONE SEEN
Bilirubin Urine: NEGATIVE
Glucose, UA: NEGATIVE mg/dL
Ketones, ur: NEGATIVE mg/dL
Leukocytes, UA: NEGATIVE
Nitrite: NEGATIVE
Protein, ur: NEGATIVE mg/dL
Specific Gravity, Urine: 1.018 (ref 1.005–1.030)
pH: 5 (ref 5.0–8.0)

## 2018-01-30 LAB — COMPREHENSIVE METABOLIC PANEL
ALT: 21 U/L (ref 0–44)
AST: 22 U/L (ref 15–41)
Albumin: 3.9 g/dL (ref 3.5–5.0)
Alkaline Phosphatase: 64 U/L (ref 38–126)
Anion gap: 11 (ref 5–15)
BUN: 11 mg/dL (ref 6–20)
CO2: 21 mmol/L — ABNORMAL LOW (ref 22–32)
Calcium: 9.5 mg/dL (ref 8.9–10.3)
Chloride: 108 mmol/L (ref 98–111)
Creatinine, Ser: 0.88 mg/dL (ref 0.44–1.00)
GFR calc Af Amer: >60 mL/min (ref >60)
GFR calc non Af Amer: >60 mL/min (ref >60)
Glucose, Bld: 84 mg/dL (ref 70–99)
Potassium: 3.9 mmol/L (ref 3.5–5.1)
Sodium: 140 mmol/L (ref 135–145)
Total Bilirubin: 0.7 mg/dL (ref 0.3–1.2)
Total Protein: 8 g/dL (ref 6.5–8.1)

## 2018-01-30 LAB — CBC
HCT: 37.9 % (ref 36.0–46.0)
Hemoglobin: 12.1 g/dL (ref 12.0–15.0)
MCH: 26.8 pg (ref 26.0–34.0)
MCHC: 31.9 g/dL (ref 30.0–36.0)
MCV: 83.8 fL (ref 80.0–100.0)
Platelets: 285 10*3/uL (ref 150–400)
RBC: 4.52 MIL/uL (ref 3.87–5.11)
RDW: 15.2 % (ref 11.5–15.5)
WBC: 7.9 10*3/uL (ref 4.0–10.5)
nRBC: 0 % (ref 0.0–0.2)

## 2018-01-30 LAB — LIPASE, BLOOD: Lipase: 34 U/L (ref 11–51)

## 2018-01-30 LAB — I-STAT BETA HCG BLOOD, ED (MC, WL, AP ONLY): I-stat hCG, quantitative: >2000 m[IU]/mL — ABNORMAL HIGH (ref <5)

## 2018-01-30 MED ORDER — PANTOPRAZOLE SODIUM 40 MG PO TBEC
40.0000 mg | DELAYED_RELEASE_TABLET | Freq: Once | ORAL | Status: AC
  Administered 2018-01-30: 40 mg via ORAL
  Filled 2018-01-30: qty 1

## 2018-01-30 MED ORDER — SUCRALFATE 1 G PO TABS: 1.0000 g | ORAL_TABLET | Freq: Three times a day (TID) | ORAL | 0 refills | Status: DC

## 2018-01-30 MED ORDER — PANTOPRAZOLE SODIUM 20 MG PO TBEC: 20.0000 mg | DELAYED_RELEASE_TABLET | Freq: Two times a day (BID) | ORAL | 0 refills | Status: DC

## 2018-01-30 MED ORDER — GI COCKTAIL ~~LOC~~
30.0000 mL | Freq: Once | ORAL | Status: AC
  Administered 2018-01-30: 30 mL via ORAL
  Filled 2018-01-30: qty 30

## 2018-01-30 MED ORDER — LACTATED RINGERS IV BOLUS
1000.0000 mL | Freq: Once | INTRAVENOUS | Status: AC
  Administered 2018-01-30: 1000 mL via INTRAVENOUS

## 2018-01-30 NOTE — ED Notes (Signed)
US at bedside with patient

## 2018-01-30 NOTE — ED Triage Notes (Signed)
Patient arrives with epigastric pain x5 weeks. Patient states takes Pepcid morning and night. Patient called OB doctor and told to come to ED. +Nausea, -vomiting. Patient [redacted] weeks pregnant, denies any vaginal bleeding.

## 2018-01-30 NOTE — ED Provider Notes (Signed)
[facility]-EMERGENCY DEPT Provider Note   CSN: [id number] Arrival date & time: 01/30/18  1503     History   Chief Complaint Chief Complaint  Patient presents with  . Abdominal Pain    HPI Loretta Dixon is a 37 y.o. female.  HPI   37 year old female with abdominal pain.  Epigastric.  Intermittent for the past 5 weeks.  She describes a deep burning pain in epigastric region.  Does not radiate.  Often worse about an hour after eating.  She has not noticed any appreciable exacerbating relieving factors otherwise.  Nauseated but no vomiting.  She is approximately [redacted] weeks pregnant with twins.  No lower abdominal or pelvic pain.  She has been taking Pepcid twice a day for several weeks without obvious improvement.  Past Medical History:  Diagnosis Date  . Anemia   . Candidiasis of mouth   . Galactorrhea not associated with childbirth   . Hypertension   . Vaginal Pap smear, abnormal     Patient Active Problem List   Diagnosis Date Noted  . Normal labor 04/03/2014  . Chronic hypertension complicating or reason for care during pregnancy 04/03/2014  . Postpartum care following vaginal delivery (12/14) 04/03/2014  . HBP (high blood pressure) 06/25/2012    Past Surgical History:  Procedure Laterality Date  . DILATION AND CURETTAGE OF UTERUS  July 2013     OB History    Gravida  4   Para  3   Term  2   Preterm  1   AB      Living  3     SAB      TAB      Ectopic      Multiple  0   Live Births  3            Home Medications    Prior to Admission medications   Medication Sig Start Date End Date Taking? Authorizing Provider  Doxylamine-Pyridoxine 10-10 MG TBEC Take 2 tablets by mouth at bedtime.  01/28/18  Yes [provider]  famotidine (PEPCID) 10 MG tablet Take 10 mg by mouth 2 (two) times daily.   Yes [provider]  folic acid (FOLVITE) 1 MG tablet Take 1 tablet by mouth daily. 04/28/12  Yes [provider]  metoprolol succinate (TOPROL-XL) 50 MG 24 hr tablet Take 50 mg by mouth daily. 01/03/18  Yes [provider]  NIFEdipine (PROCARDIA-XL/ADALAT-CC/NIFEDICAL-XL) 30 MG 24 hr tablet Take 30 mg by mouth daily.   Yes [provider]  ondansetron (ZOFRAN-ODT) 4 MG disintegrating tablet Take 4 mg by mouth 3 (three) times daily as needed for nausea. 12/31/17  Yes [provider]  Prenatal Vit-Fe Fumarate-FA (PRENATAL MULTIVITAMIN) TABS tablet Take 1 tablet by mouth daily at 12 noon.   Yes [provider]    Family History Family History  Problem Relation Age of Onset  . Asthma Maternal Grandmother   . Hypertension Maternal Grandmother   . Asthma Maternal Aunt   . Hypertension Mother   . Hypertension Father   . Hypertension Maternal Grandfather     Social History Social History   Tobacco Use  . Smoking status: Never Smoker  . Smokeless tobacco: Never Used  Substance Use Topics  . Alcohol use: No  . Drug use: No     Allergies   Labetalol   Review of Systems Review of Systems  All systems reviewed and negative, other than as noted in HPI.  Physical  Exam Updated Vital Signs BP (!) 154/105   Pulse 84   Temp 98 F (36.7 C) (Oral)   Resp 17   Ht 5\' 3"  (1.6 m)   Wt 76.2 kg   SpO2 99%   BMI 29.76 kg/m   Physical Exam  Constitutional: She appears well-developed and well-nourished. No distress.  HENT:  Head: Normocephalic and atraumatic.  Eyes: Conjunctivae are normal. Right eye exhibits no discharge. Left eye exhibits no discharge.  Neck: Neck supple.  Cardiovascular: Normal rate, regular rhythm and normal heart sounds. Exam reveals no gallop and no friction rub.  No murmur heard. Pulmonary/Chest: Effort normal and breath sounds normal. No respiratory distress.  Abdominal: Soft. She exhibits no distension.  Minimal epigastric tenderness without rebound or guarding.  No distention.  Musculoskeletal: She exhibits no edema or  tenderness.  Neurological: She is alert.  Skin: Skin is warm and dry.  Psychiatric: She has a normal mood and affect. Her behavior is normal. Thought content normal.  Nursing note and vitals reviewed.    ED Treatments / Results  Labs (all labs ordered are listed, but only abnormal results are displayed) Labs Reviewed  COMPREHENSIVE METABOLIC PANEL - Abnormal; Notable for the following components:      Result Value   CO2 21 (*)    All other components within normal limits  URINALYSIS, ROUTINE W REFLEX MICROSCOPIC - Abnormal; Notable for the following components:   Hgb urine dipstick SMALL (*)    All other components within normal limits  I-STAT BETA HCG BLOOD, ED ([facility], [facility], [facility] ONLY) - Abnormal; Notable for the following components:   I-stat hCG, quantitative >2,000.0 (*)    All other components within normal limits  LIPASE, BLOOD  CBC    EKG None  Radiology No results found.  US Abdomen Limited  Result Date: 01/30/2018 CLINICAL DATA:  Postprandial epigastric pain for 5 weeks. EXAM: ULTRASOUND ABDOMEN LIMITED RIGHT UPPER QUADRANT COMPARISON:  None. FINDINGS: Gallbladder: No gallstones or wall thickening visualized. No sonographic Murphy sign noted by sonographer. Common bile duct: Diameter: 4 mm, within normal limits. Liver: No focal lesion identified. Within normal limits in parenchymal echogenicity. Portal vein is patent on color Doppler imaging with normal direction of blood flow towards the liver. IMPRESSION: Negative.  No hepatobiliary abnormality identified. Electronically Signed   By: Myles Rosenthal M.D.   On: 01/30/2018 17:52    Procedures Procedures (including critical care time)  Medications Ordered in ED Medications  lactated ringers bolus 1,000 mL (has no administration in time range)  gi cocktail (Maalox,Lidocaine,Donnatal) (has no administration in time range)  pantoprazole (PROTONIX) EC tablet 40 mg (has no administration in time range)     Initial Impression  / Assessment and Plan / ED Course  I have reviewed the triage vital signs and the nursing notes.  Pertinent labs & imaging results that were available during my care of the patient were reviewed by me and considered in my medical decision making (see chart for details).     37 year old female with epigastric pain.  Consider PUD, gastritis, GERD, symptomatically cholelithiasis, etc.  She has already been taking Pepcid regularly without improvement.  We will give her GI cocktail.  Fluids.  Check labs including LFTs and lipase.  Will obtain right upper quadrant ultrasound.  Ultrasound looks fine.  Labs are stopped H2-blocker and give a trial of PPI.  Also try Carafate.  She may potentially end up needing an EGD but unlikely this will be done during pregnancy.  Return precautions were discussed.  Outpatient follow-up otherwise.   Final Clinical Impressions(s) / ED Diagnoses   Final diagnoses:  Epigastric pain    ED Discharge Orders    None       Raeford Razor, MD 02/01/18 1358

## 2018-03-15 ENCOUNTER — Other Ambulatory Visit (HOSPITAL_COMMUNITY): Payer: Self-pay | Admitting: Obstetrics and Gynecology

## 2018-03-15 DIAGNOSIS — O30042 Twin pregnancy, dichorionic/diamniotic, second trimester: Secondary | ICD-10-CM

## 2018-03-15 DIAGNOSIS — O09212 Supervision of pregnancy with history of pre-term labor, second trimester: Secondary | ICD-10-CM

## 2018-03-15 DIAGNOSIS — Z3A19 19 weeks gestation of pregnancy: Secondary | ICD-10-CM

## 2018-03-15 DIAGNOSIS — O26879 Cervical shortening, unspecified trimester: Secondary | ICD-10-CM

## 2018-03-20 ENCOUNTER — Encounter (HOSPITAL_COMMUNITY): Payer: Self-pay | Admitting: *Deleted

## 2018-03-20 ENCOUNTER — Other Ambulatory Visit: Payer: Self-pay | Admitting: Obstetrics and Gynecology

## 2018-03-20 ENCOUNTER — Inpatient Hospital Stay (HOSPITAL_BASED_OUTPATIENT_CLINIC_OR_DEPARTMENT_OTHER): Payer: 59

## 2018-03-20 ENCOUNTER — Inpatient Hospital Stay (HOSPITAL_COMMUNITY)
Admission: AD | Admit: 2018-03-20 | Discharge: 2018-03-20 | Disposition: A | Discharge status: Home / Self Care | Payer: 59 | Source: Ambulatory Visit | Attending: Obstetrics and Gynecology | Admitting: Obstetrics and Gynecology

## 2018-03-20 DIAGNOSIS — O09522 Supervision of elderly multigravida, second trimester: Secondary | ICD-10-CM | POA: Diagnosis not present

## 2018-03-20 DIAGNOSIS — O30042 Twin pregnancy, dichorionic/diamniotic, second trimester: Secondary | ICD-10-CM | POA: Insufficient documentation

## 2018-03-20 DIAGNOSIS — N883 Incompetence of cervix uteri: Secondary | ICD-10-CM

## 2018-03-20 DIAGNOSIS — Z3A17 17 weeks gestation of pregnancy: Secondary | ICD-10-CM

## 2018-03-20 DIAGNOSIS — Z3686 Encounter for antenatal screening for cervical length: Secondary | ICD-10-CM | POA: Diagnosis not present

## 2018-03-20 DIAGNOSIS — O09212 Supervision of pregnancy with history of pre-term labor, second trimester: Secondary | ICD-10-CM | POA: Diagnosis not present

## 2018-03-20 DIAGNOSIS — O26872 Cervical shortening, second trimester: Secondary | ICD-10-CM

## 2018-03-20 DIAGNOSIS — O321XX1 Maternal care for breech presentation, fetus 1: Secondary | ICD-10-CM | POA: Insufficient documentation

## 2018-03-20 NOTE — MAU Provider Note (Signed)
History     Chief Complaint  Patient presents with  . Pregnancy Ultrasound   37 yo G4P3 MBF @ 17 wk presents for sonogram evaluation of cervical length. Pt was found to have shortened cervix on sonogram done a week ago. Hx PTB . Twin gestation IVF pregnancy. Pt is on progesterone injections. Pt denies any ctx or cramping  OB History    Gravida  4   Para  3   Term  2   Preterm  1   AB      Living  3     SAB      TAB      Ectopic      Multiple  0   Live Births  3           Past Medical History:  Diagnosis Date  . Anemia   . Candidiasis of mouth   . Galactorrhea not associated with childbirth   . Hypertension   . Vaginal Pap smear, abnormal     Past Surgical History:  Procedure Laterality Date  . DILATION AND CURETTAGE OF UTERUS  July 2013    Family History  Problem Relation Age of Onset  . Asthma Maternal Grandmother   . Hypertension Maternal Grandmother   . Asthma Maternal Aunt   . Hypertension Mother   . Hypertension Father   . Hypertension Maternal Grandfather     Social History   Tobacco Use  . Smoking status: Never Smoker  . Smokeless tobacco: Never Used  Substance Use Topics  . Alcohol use: No  . Drug use: No    Allergies:  Allergies  Allergen Reactions  . Labetalol Shortness Of Breath    Medications Prior to Admission  Medication Sig Dispense Refill Last Dose  . Doxylamine-Pyridoxine 10-10 MG TBEC Take 2 tablets by mouth at bedtime.    01/29/2018 at Unknown time  . folic acid (FOLVITE) 1 MG tablet Take 1 tablet by mouth daily.   01/29/2018 at Unknown time  . metoprolol succinate (TOPROL-XL) 50 MG 24 hr tablet Take 50 mg by mouth daily.  3 01/30/2018 at 800  . NIFEdipine (PROCARDIA-XL/ADALAT-CC/NIFEDICAL-XL) 30 MG 24 hr tablet Take 30 mg by mouth daily.   01/29/2018 at Unknown time  . ondansetron (ZOFRAN-ODT) 4 MG disintegrating tablet Take 4 mg by mouth 3 (three) times daily as needed for nausea.  1 01/29/2018 at Unknown time   . pantoprazole (PROTONIX) 20 MG tablet Take 1 tablet (20 mg total) by mouth 2 (two) times daily before a meal. 60 tablet 0   . Prenatal Vit-Fe Fumarate-FA (PRENATAL MULTIVITAMIN) TABS tablet Take 1 tablet by mouth daily at 12 noon.   01/29/2018 at Unknown time  . sucralfate (CARAFATE) 1 g tablet Take 1 tablet (1 g total) by mouth 4 (four) times daily -  with meals and at bedtime. 30 tablet 0      Physical Exam   Blood pressure 133/76, pulse 91, temperature 98.4 F (36.9 C), temperature source Oral, resp. rate 18, SpO2 100 %, unknown if currently breastfeeding.  No exam performed today, here for sonogram. ED Course   cervical shortening in pregnancy, 2nd trimester IVF pregnancy Twin gestation ( di-di) @ 17 wks Hx preterm birth P) OB limited CL MDM  Addendum: [place] Mfm Ob Transvaginal  Result Date: 03/20/2018 ----------------------------------------------------------------------  OBSTETRICS REPORT                       (Signed Final 03/20/2018 01:07 pm) ----------------------------------------------------------------------  Patient Info  ID #:       [id number]                          D.O.B.:  05/04/1980 (37 yrs)  Name:       Loretta Dixon                Visit Date: 03/20/2018 09:15 am ---------------------------------------------------------------------- Performed By  Performed By:     Birdena Crandall        Secondary Phy.:    Nena Jordan                    RDMS,RVT                                                              Naven Giambalvo MD  Attending:        Lin Landsman      Address:           [facility]                    MD                                                              [facility]                                                              [facility]                                                              [address]                                                              [place], [place]                                                              [address]   Referred By:      MAU Nursing-           Location:          [facility]                    MAU/Triage ---------------------------------------------------------------------- Orders   #  Description                          Code         Ordered By   1  [place] MFM OB LIMITED                    [phone]     Carrye Goller   2  [place] MFM OB TRANSVAGINAL               [phone]      Mackenna Kamer  ----------------------------------------------------------------------   #  Order #                    Accession #                 Episode #   1  [id number]                  [id number]   2  [id number]                  [id number]                  [id number]  ---------------------------------------------------------------------- Indications   Encounter for cervical length                  Z36.86   Twin pregnancy, di/di, second trimester        O30.042   Poor obstetric history: Previous preterm       O09.219   delivery, antepartum   [redacted] weeks gestation of pregnancy                Z3A.17  ---------------------------------------------------------------------- Fetal Evaluation (Fetus A)  Num Of Fetuses:          2  Fetal Heart Rate(bpm):   154  Cardiac Activity:        Observed  Fetal Lie:               Maternal left side  Presentation:            Breech  Placenta:                Anterior  P. Cord Insertion:       Visualized, central  Membrane Desc:      Dividing Membrane seen  Amniotic Fluid  AFI FV:      Within normal limits                              Largest Pocket(cm)  6.42 ---------------------------------------------------------------------- OB History  Gravidity:    4         Term:   2        Prem:   1  Living:       3 ---------------------------------------------------------------------- Gestational Age (Fetus A)  LMP:           17w 1d        Date:  11/20/17                  EDD:   08/27/18  Best:          17w 1d     Det. By:  LMP  (11/20/17)          EDD:   08/27/18 ---------------------------------------------------------------------- Anatomy (Fetus A)  Ventricles:            Appears normal         Stomach:                Appears normal, left                                                                        sided  Choroid Plexus:        Appears normal         Bladder:                Appears normal ---------------------------------------------------------------------- Fetal Evaluation (Fetus B)  Num Of Fetuses:          2  Fetal Heart Rate(bpm):   137  Cardiac Activity:        Observed  Fetal Lie:               Maternal right side  Presentation:            Transverse, head to maternal right  Placenta:                Anterior  P. Cord Insertion:       Visualized, central  Membrane Desc:      Dividing Membrane seen  Amniotic Fluid  AFI FV:      Within normal limits                              Largest Pocket(cm)                              5.99 ---------------------------------------------------------------------- Gestational Age (Fetus B)  LMP:           17w 1d        Date:  11/20/17                 EDD:   08/27/18  Best:          17w 1d     Det. By:  LMP  (11/20/17)          EDD:   08/27/18 ---------------------------------------------------------------------- Anatomy (Fetus B)  Stomach:               Appears normal         Bladder:  Appears normal ---------------------------------------------------------------------- Cervix Uterus Adnexa  Cervix  Length:           2.66  cm.  Normal appearance by transvaginal scan  Uterus  No abnormality visualized.  Left Ovary  Not visualized.  Right Ovary  Not visualized.  Cul De Sac  No free fluid seen.  Adnexa  No abnormality visualized. ---------------------------------------------------------------------- Impression  Cervical length is wiithin normal limits  ---------------------------------------------------------------------- Recommendations  Follow up as clinically indicated.  Consider follow up cervical length in 2 weeks given patients  prior history. ----------------------------------------------------------------------               Lin Landsman, MD Electronically Signed Final Report   03/20/2018 01:07 pm ----------------------------------------------------------------------  [place] Mfm Ob Limited  Result Date: 03/20/2018 ----------------------------------------------------------------------  OBSTETRICS REPORT                       (Signed Final 03/20/2018 01:07 pm) ---------------------------------------------------------------------- Patient Info  ID #:       [id number]                          D.O.B.:  1980-09-04 (37 yrs)  Name:       Loretta Dixon                Visit Date: 03/20/2018 09:15 am ---------------------------------------------------------------------- Performed By  Performed By:     Birdena Crandall        Secondary Phy.:    Nena Jordan                    RDMS,RVT                                                              Dequavion Follette MD  Attending:        Lin Landsman      Address:           [facility]                    MD                                                              [facility]                                                              [facility]                                                              [address]  [place], [place]                                                              [id number]  Referred By:      MAU Nursing-           [facility]                    MAU/Triage ---------------------------------------------------------------------- Orders   #  Description                          Code         Ordered By   1  US MFM OB LIMITED                    76815.01     Tiarra Anastacio   2  US MFM OB TRANSVAGINAL               [phone]      Leiana Rund  ----------------------------------------------------------------------   #  Order #                    Accession #                 Episode #   1  [id number]                  [id number]   2  [id number]                  [id number]  ---------------------------------------------------------------------- Indications   Encounter for cervical length                  Z36.86   Twin pregnancy, di/di, second trimester        O30.042   Poor obstetric history: Previous preterm       O09.219   delivery, antepartum   [redacted] weeks gestation of pregnancy                Z3A.17  ---------------------------------------------------------------------- Fetal Evaluation (Fetus A)  Num Of Fetuses:          2  Fetal Heart Rate(bpm):   154  Cardiac Activity:        Observed  Fetal Lie:  Maternal left side  Presentation:            Breech  Placenta:                Anterior  P. Cord Insertion:       Visualized, central  Membrane Desc:      Dividing Membrane seen  Amniotic Fluid  AFI FV:      Within normal limits                              Largest Pocket(cm)                              6.42 ---------------------------------------------------------------------- OB History  Gravidity:    4         Term:   2        Prem:   1  Living:       3 ---------------------------------------------------------------------- Gestational Age (Fetus A)  LMP:           17w 1d        Date:  11/20/17                 EDD:   08/27/18  Best:          17w 1d     Det. By:  LMP  (11/20/17)          EDD:   08/27/18 ---------------------------------------------------------------------- Anatomy (Fetus A)  Ventricles:            Appears normal         Stomach:                Appears normal, left                                                                         sided  Choroid Plexus:        Appears normal         Bladder:                Appears normal ---------------------------------------------------------------------- Fetal Evaluation (Fetus B)  Num Of Fetuses:          2  Fetal Heart Rate(bpm):   137  Cardiac Activity:        Observed  Fetal Lie:               Maternal right side  Presentation:            Transverse, head to maternal right  Placenta:                Anterior  P. Cord Insertion:       Visualized, central  Membrane Desc:      Dividing Membrane seen  Amniotic Fluid  AFI FV:      Within normal limits                              Largest Pocket(cm)  5.99 ---------------------------------------------------------------------- Gestational Age (Fetus B)  LMP:           17w 1d        Date:  11/20/17                 EDD:   08/27/18  Best:          17w 1d     Det. By:  LMP  (11/20/17)          EDD:   08/27/18 ---------------------------------------------------------------------- Anatomy (Fetus B)  Stomach:               Appears normal         Bladder:                Appears normal ---------------------------------------------------------------------- Cervix Uterus Adnexa  Cervix  Length:           2.66  cm.  Normal appearance by transvaginal scan  Uterus  No abnormality visualized.  Left Ovary  Not visualized.  Right Ovary  Not visualized.  Cul De Sac  No free fluid seen.  Adnexa  No abnormality visualized. ---------------------------------------------------------------------- Impression  Cervical length is wiithin normal limits ---------------------------------------------------------------------- Recommendations  Follow up as clinically indicated.  Consider follow up cervical length in 2 weeks given patients  prior history. ----------------------------------------------------------------------               Lin Landsman, MD Electronically Signed Final Report   03/20/2018 01:07 pm  ---------------------------------------------------------------------- reviewed findings with pt: d/c home Keep sched OB appt.  Serita Kyle, MD 9:42 AM 03/20/2018

## 2018-03-20 NOTE — MAU Note (Signed)
Patient here for ultrasound for cervical length.  Denies any other concerns at this time including no vaginal bleeding or LOF.

## 2018-04-21 NOTE — L&D Delivery Note (Signed)
Delivery Note   Kanchan, Liefer [[id number]]  At 4:09 PM a viable and healthy female was delivered via Vaginal, Spontaneous (Presentation:vtx ; OA ).  APGAR: 8, 9; weight  pending.   Placenta status:spontanoeus intact to path , .  Cord:none  with the following complications: none.  Anesthesia:  epidural Episiotomy: None Lacerations: None Suture Repair: n/a Est. Blood Loss (mL): 150    Kalyce, Balek Hewitt [[id number]]  At 4:18 PM a viable and healthy female was delivered via Vaginal, Vacuum due to fetal bradycardia Investment banker, operational) (Presentation:vtx ;ROP  ).  APGAR: 9, 9; weight pending  .   1st twin delivered spontaneously. VE revealed 2nd twin vtx to maternal right.  FHR check 100's. AROm clear fluid. No palp cord noted. FHR continued to diff to get externally. ISE placed. FHR bradycardia noted. At that pt, decision made for vacuum as vtx noted. Mushroom applied. Pulled x 1 with no pop off brought heat to outlet and the pt was then allowed to push the baby out on own  Verbal consent from mother for vacuum application due to fetal heart rate deceleration noted after delivery of twin A Instrument: mushroom vacuum Placenta status:spontaneous x 2 intact to path , .  Cord:  with the following complications: none. Anesthesia:  eoidural Episiotomy: None Lacerations: None Suture Repair: n/a Est. Blood Loss (mL): 150   Mom to postpartum.   Baby A to Couplet care / Skin to Skin.   Baby B to Couplet care / Skin to Skin.  Arelly Whittenberg A Khair Chasteen 08/01/2018, 5:01 PM

## 2018-04-23 ENCOUNTER — Other Ambulatory Visit: Payer: Self-pay

## 2018-04-23 ENCOUNTER — Encounter (HOSPITAL_COMMUNITY): Payer: Self-pay | Admitting: *Deleted

## 2018-04-23 ENCOUNTER — Observation Stay (HOSPITAL_COMMUNITY)
Admission: AD | Admit: 2018-04-23 | Discharge: 2018-04-24 | Disposition: A | Discharge status: Home / Self Care | Payer: 59 | Attending: Obstetrics and Gynecology | Admitting: Obstetrics and Gynecology

## 2018-04-23 DIAGNOSIS — O26872 Cervical shortening, second trimester: Secondary | ICD-10-CM | POA: Diagnosis present

## 2018-04-23 DIAGNOSIS — O30042 Twin pregnancy, dichorionic/diamniotic, second trimester: Secondary | ICD-10-CM | POA: Diagnosis present

## 2018-04-23 DIAGNOSIS — R42 Dizziness and giddiness: Secondary | ICD-10-CM | POA: Diagnosis present

## 2018-04-23 DIAGNOSIS — Z3A22 22 weeks gestation of pregnancy: Secondary | ICD-10-CM | POA: Insufficient documentation

## 2018-04-23 DIAGNOSIS — Z79899 Other long term (current) drug therapy: Secondary | ICD-10-CM | POA: Insufficient documentation

## 2018-04-23 DIAGNOSIS — O26892 Other specified pregnancy related conditions, second trimester: Secondary | ICD-10-CM | POA: Insufficient documentation

## 2018-04-23 DIAGNOSIS — O4703 False labor before 37 completed weeks of gestation, third trimester: Secondary | ICD-10-CM

## 2018-04-23 DIAGNOSIS — O4702 False labor before 37 completed weeks of gestation, second trimester: Secondary | ICD-10-CM | POA: Diagnosis not present

## 2018-04-23 DIAGNOSIS — O162 Unspecified maternal hypertension, second trimester: Secondary | ICD-10-CM | POA: Insufficient documentation

## 2018-04-23 DIAGNOSIS — Z7982 Long term (current) use of aspirin: Secondary | ICD-10-CM | POA: Diagnosis not present

## 2018-04-23 LAB — URINALYSIS, ROUTINE W REFLEX MICROSCOPIC
Bilirubin Urine: NEGATIVE
Glucose, UA: NEGATIVE mg/dL
Hgb urine dipstick: NEGATIVE
Ketones, ur: 80 mg/dL — AB
LEUKOCYTES UA: NEGATIVE
Nitrite: NEGATIVE
Protein, ur: NEGATIVE mg/dL
Specific Gravity, Urine: 1.006 (ref 1.005–1.030)
pH: 6 (ref 5.0–8.0)

## 2018-04-23 LAB — TYPE AND SCREEN
ABO/RH(D): A POS
Antibody Screen: NEGATIVE

## 2018-04-23 MED ORDER — TERBUTALINE SULFATE 1 MG/ML IJ SOLN
0.2500 mg | Freq: Once | INTRAMUSCULAR | Status: AC
  Administered 2018-04-23: 0.25 mg via SUBCUTANEOUS
  Filled 2018-04-23: qty 1

## 2018-04-23 MED ORDER — PROGESTERONE MICRONIZED 200 MG PO CAPS
200.0000 mg | ORAL_CAPSULE | Freq: Once | ORAL | Status: DC
  Filled 2018-04-23: qty 1

## 2018-04-23 MED ORDER — PROGESTERONE 200 MG VA SUPP
200.0000 mg | Freq: Once | VAGINAL | Status: DC
  Filled 2018-04-23: qty 1

## 2018-04-23 MED ORDER — VITAMIN B-6 100 MG PO TABS
100.0000 mg | ORAL_TABLET | Freq: Two times a day (BID) | ORAL | Status: DC
  Filled 2018-04-23 (×2): qty 1

## 2018-04-23 MED ORDER — LACTATED RINGERS IV SOLN
INTRAVENOUS | Status: DC
  Administered 2018-04-23: 13:00:00 via INTRAVENOUS

## 2018-04-23 MED ORDER — DOXYLAMINE SUCCINATE (SLEEP) 25 MG PO TABS
25.0000 mg | ORAL_TABLET | Freq: Two times a day (BID) | ORAL | Status: DC
  Administered 2018-04-23 – 2018-04-24 (×3): 25 mg via ORAL
  Filled 2018-04-23 (×5): qty 1

## 2018-04-23 MED ORDER — NIFEDIPINE ER OSMOTIC RELEASE 30 MG PO TB24
30.0000 mg | ORAL_TABLET | Freq: Every day | ORAL | Status: DC
  Filled 2018-04-23: qty 1

## 2018-04-23 MED ORDER — INDOMETHACIN 25 MG PO CAPS
25.0000 mg | ORAL_CAPSULE | Freq: Four times a day (QID) | ORAL | Status: DC
  Administered 2018-04-23: 25 mg via ORAL
  Filled 2018-04-23 (×4): qty 1

## 2018-04-23 MED ORDER — LACTATED RINGERS IV SOLN
INTRAVENOUS | Status: DC
  Administered 2018-04-23: 19:00:00 via INTRAVENOUS

## 2018-04-23 MED ORDER — PANTOPRAZOLE SODIUM 20 MG PO TBEC
20.0000 mg | DELAYED_RELEASE_TABLET | Freq: Two times a day (BID) | ORAL | Status: DC
  Administered 2018-04-23 – 2018-04-24 (×2): 20 mg via ORAL
  Filled 2018-04-23 (×4): qty 1

## 2018-04-23 MED ORDER — NIFEDIPINE ER OSMOTIC RELEASE 30 MG PO TB24
30.0000 mg | ORAL_TABLET | Freq: Every day | ORAL | Status: DC
  Administered 2018-04-23 – 2018-04-24 (×2): 30 mg via ORAL
  Filled 2018-04-23: qty 1

## 2018-04-23 MED ORDER — CALCIUM CARBONATE ANTACID 500 MG PO CHEW: 2.0000 | CHEWABLE_TABLET | ORAL | Status: DC | PRN

## 2018-04-23 MED ORDER — PRENATAL MULTIVITAMIN CH
1.0000 | ORAL_TABLET | Freq: Every day | ORAL | Status: DC
  Administered 2018-04-24: 1 via ORAL
  Filled 2018-04-23: qty 1

## 2018-04-23 MED ORDER — SODIUM CHLORIDE 0.9% FLUSH
3.0000 mL | Freq: Two times a day (BID) | INTRAVENOUS | Status: DC
  Administered 2018-04-24: 3 mL via INTRAVENOUS

## 2018-04-23 MED ORDER — SODIUM CHLORIDE 0.9% FLUSH: 3.0000 mL | INTRAVENOUS | Status: DC | PRN

## 2018-04-23 MED ORDER — DOCUSATE SODIUM 100 MG PO CAPS: 100.0000 mg | ORAL_CAPSULE | Freq: Every day | ORAL | Status: DC

## 2018-04-23 MED ORDER — INDOMETHACIN 25 MG PO CAPS
25.0000 mg | ORAL_CAPSULE | ORAL | Status: DC
  Administered 2018-04-23 – 2018-04-24 (×5): 25 mg via ORAL
  Filled 2018-04-23 (×11): qty 1

## 2018-04-23 MED ORDER — METOPROLOL SUCCINATE ER 50 MG PO TB24
50.0000 mg | ORAL_TABLET | Freq: Every day | ORAL | Status: DC
  Filled 2018-04-23 (×2): qty 1

## 2018-04-23 MED ORDER — PYRIDOXINE HCL 25 MG PO TABS
25.0000 mg | ORAL_TABLET | Freq: Two times a day (BID) | ORAL | Status: DC
  Administered 2018-04-23 – 2018-04-24 (×3): 25 mg via ORAL
  Filled 2018-04-23 (×5): qty 1

## 2018-04-23 MED ORDER — SODIUM CHLORIDE 0.9 % IV SOLN: 250.0000 mL | INTRAVENOUS | Status: DC | PRN

## 2018-04-23 MED ORDER — INDOMETHACIN 50 MG PO CAPS
50.0000 mg | ORAL_CAPSULE | Freq: Once | ORAL | Status: AC
  Administered 2018-04-23: 50 mg via ORAL
  Filled 2018-04-23: qty 1

## 2018-04-23 MED ORDER — ACETAMINOPHEN 325 MG PO TABS: 650.0000 mg | ORAL_TABLET | ORAL | Status: DC | PRN

## 2018-04-23 MED ORDER — ZOLPIDEM TARTRATE 5 MG PO TABS: 5.0000 mg | ORAL_TABLET | Freq: Every evening | ORAL | Status: DC | PRN

## 2018-04-23 MED ORDER — DOCUSATE SODIUM 100 MG PO CAPS
100.0000 mg | ORAL_CAPSULE | Freq: Two times a day (BID) | ORAL | Status: DC
  Administered 2018-04-23 – 2018-04-24 (×2): 100 mg via ORAL
  Filled 2018-04-23 (×2): qty 1

## 2018-04-23 NOTE — Plan of Care (Signed)
Patient admitted for observation

## 2018-04-23 NOTE — MAU Provider Note (Addendum)
History     CSN: [id number]  Arrival date and time: 04/23/18 1223   First Provider Initiated Contact with Patient 04/23/18 1225      Chief Complaint  Patient presents with  . Contractions   HPI  [name] is a 38 y.o. year old G75P2103 female at [redacted]w[redacted]d weeks with Di-Di twin gestation who presents to MAU reporting she was just seen at her OB office for weekly U/S and was dx'd with a shortened cervix of 1.7 cm with funneling. Her OB sent her here for further evaluation. Dr. Amado Nash was unable to perform a speculum exam while she was in the office. The pt was not told findings of that partial exam. Dr. Amado Nash did a digital cervical exam and she ws closed. She was placed on NST and was found to be contracting every 2 mins. She reports last SI was when she was [redacted] wks pregnant. She has h/o cHTN and is taking Procardia 30 mg XL hs and Metoprolol 50 mg to manage that. She also takes Colace. She receives [facility] from Dr. Cherly Hensen at [facility].  Past Medical History:  Diagnosis Date  . Anemia   . Candidiasis of mouth   . Galactorrhea not associated with childbirth   . Hypertension   . Vaginal Pap smear, abnormal     Past Surgical History:  Procedure Laterality Date  . DILATION AND CURETTAGE OF UTERUS  July 2013    Family History  Problem Relation Age of Onset  . Asthma Maternal Grandmother   . Hypertension Maternal Grandmother   . Asthma Maternal Aunt   . Hypertension Mother   . Hypertension Father   . Hypertension Maternal Grandfather     Social History   Tobacco Use  . Smoking status: Never Smoker  . Smokeless tobacco: Never Used  Substance Use Topics  . Alcohol use: No  . Drug use: No    Allergies:  Allergies  Allergen Reactions  . Labetalol Shortness Of Breath    Medications Prior to Admission  Medication Sig Dispense Refill Last Dose  . Doxylamine-Pyridoxine 10-10 MG TBEC Take 2 tablets by mouth at bedtime.    01/29/2018 at Unknown time  . folic acid (FOLVITE)  1 MG tablet Take 1 tablet by mouth daily.   01/29/2018 at Unknown time  . metoprolol succinate (TOPROL-XL) 50 MG 24 hr tablet Take 50 mg by mouth daily.  3 01/30/2018 at 800  . NIFEdipine (PROCARDIA-XL/ADALAT-CC/NIFEDICAL-XL) 30 MG 24 hr tablet Take 30 mg by mouth daily.   01/29/2018 at Unknown time  . ondansetron (ZOFRAN-ODT) 4 MG disintegrating tablet Take 4 mg by mouth 3 (three) times daily as needed for nausea.  1 01/29/2018 at Unknown time  . pantoprazole (PROTONIX) 20 MG tablet Take 1 tablet (20 mg total) by mouth 2 (two) times daily before a meal. 60 tablet 0   . Prenatal Vit-Fe Fumarate-FA (PRENATAL MULTIVITAMIN) TABS tablet Take 1 tablet by mouth daily at 12 noon.   01/29/2018 at Unknown time  . sucralfate (CARAFATE) 1 g tablet Take 1 tablet (1 g total) by mouth 4 (four) times daily -  with meals and at bedtime. 30 tablet 0     Review of Systems  Constitutional: Negative.   HENT: Negative.   Eyes: Negative.   Respiratory: Negative.   Cardiovascular: Negative.   Gastrointestinal: Negative.   Endocrine: Negative.   Genitourinary: Positive for pelvic pain (UC's every 2-3 mins).  Musculoskeletal: Negative.   Skin: Negative.   Allergic/Immunologic: Negative.  Neurological: Negative.   Hematological: Negative.   Psychiatric/Behavioral: Negative.    Physical Exam   Blood pressure (!) 141/83, pulse 90, temperature 98.1 F (36.7 C), resp. rate 16, height 5\' 3"  (1.6 m), weight 79.4 kg.  Physical Exam  Nursing note and vitals reviewed. Constitutional: She is oriented to person, place, and time. She appears well-developed and well-nourished.  HENT:  Head: Normocephalic and atraumatic.  Eyes: Pupils are equal, round, and reactive to light.  Neck: Normal range of motion.  Cardiovascular: Normal rate and regular rhythm.  Respiratory: Effort normal.  GI: Soft.  Musculoskeletal: Normal range of motion.  Neurological: She is alert and oriented to person, place, and time.  Skin: Skin  is warm and dry.  Psychiatric: She has a normal mood and affect. Her behavior is normal. Judgment and thought content normal.   MAU Course  Procedures  MDM LR 1000 ml bolus CEFM Toco only -- UC's every 2-3 mins Terbutaline 0.25 mg SQ -- spacing out UC's Progesterone 200 mg vaginally FHTs by doppler: (A) 187 bpm, (B) 146 bpm  Reassessment 1 hr after Indocin dose -- pt states she is "feeling very light-headed and woozy". She states that this is not a new issue. She states that she occasionally feels "dizzy & light-headed" just like now at home. She states this happened in her last pregnancy and she had to stop taking her BP medications.  *Consult with Dr. Macon Large @ 1300 - notified of patient's complaints, assessments, lab, U/S, speculum exam results, recommended tx plan give vaginal progesterone, Terbutaline 0.25 mg SQ  *Consult with Dr. Billy Coast @ 1307 - notified of patient's complaints, assessments, recommended tx plan from Dr. Macon Large to give Terbutaline 0.25 mg SQ   **Dr. Billy Coast bedside @ 1320 -- orders received for oral Indocin 50 mg (to be Rx'd every 6 hrs x 48 hrs), update in 1 hour  **Update TC to Dr. Billy Coast @ 1440 -- notified of pt having fewer UC's UI only, but still able to feel them // offer pt admission for 23 hr observation or ok with d/c home -- continue with Indocin 25 mg every 6 hrs po and repeat CL in AM, if decides on admission. 1450 - Dr. Billy Coast updated on pt's desire to be admitted for obs.     Assessment and Plan  Preterm uterine contractions in third trimester, antepartum - Admit to HROB unit  - Continue daily medications - Indocin 25 mg every 6 hrs po  - Repeat CL on U/S in the AM - Dr. Billy Coast will come see pt in the AM    Raelyn Mora, MSN, CNM 04/23/2018, 12:32 PM

## 2018-04-23 NOTE — MAU Note (Signed)
Pt sent form office for monitoring fo contractions.

## 2018-04-23 NOTE — Progress Notes (Signed)
Patient ID: Loretta Dixon, female   DOB: Apr 14, 1981, 38 y.o.   MRN: [id number] Increased cramping this evening. No bleeding or LOF. Good FM  BP 114/73 (BP Location: Left Arm)   Pulse 76   Temp 98.1 F (36.7 C) (Oral)   Resp 19   Ht 5\' 3"  (1.6 m)   Wt 79.4 kg   SpO2 100%   BMI 31.00 kg/m   Toco with more irregular contractions Fundus NT VE deferred  IMP: PTL Inc Indocin to q 4 Give procardia dose now Will hold toprol and inc procardia if needed

## 2018-04-23 NOTE — MAU Provider Note (Signed)
History     CSN: [id number]  Arrival date and time: 04/23/18 1223   First Provider Initiated Contact with Patient 04/23/18 1225      Chief Complaint  Patient presents with  . Contractions   HPI  [name] is a 38 y.o. year old [id number] female at [redacted]w[redacted]d weeks with Di-Di twin gestation who presents to MAU reporting she was just seen at her OB office for weekly U/S and was dx'd with a shortened cervix of 1.7 cm with funneling. Initial CL > 4cm then dynamic questionably due to contractions. No change in dc or LOF. No dysuria.l    She has h/o cHTN and is taking Procardia 30 mg XL hs and Metoprolol 50 mg to manage that. She also takes Colace. She receives [facility] from Dr. Cherly Hensen at [facility].  Past Medical History:  Diagnosis Date  . Anemia   . Candidiasis of mouth   . Galactorrhea not associated with childbirth   . Hypertension   . Vaginal Pap smear, abnormal     Past Surgical History:  Procedure Laterality Date  . DILATION AND CURETTAGE OF UTERUS  July 2013    Family History  Problem Relation Age of Onset  . Asthma Maternal Grandmother   . Hypertension Maternal Grandmother   . Asthma Maternal Aunt   . Hypertension Mother   . Hypertension Father   . Hypertension Maternal Grandfather     Social History   Tobacco Use  . Smoking status: Never Smoker  . Smokeless tobacco: Never Used  Substance Use Topics  . Alcohol use: No  . Drug use: No    Allergies:  Allergies  Allergen Reactions  . Labetalol Shortness Of Breath    Medications Prior to Admission  Medication Sig Dispense Refill Last Dose  . aspirin EC 81 MG tablet Take 81 mg by mouth daily.   04/22/2018 at Unknown time  . docusate sodium (COLACE) 100 MG capsule Take 100 mg by mouth daily.   04/22/2018 at Unknown time  . Doxylamine-Pyridoxine 10-10 MG TBEC Take 2 tablets by mouth at bedtime. Pt also takes one in the am and one at lunch.   04/23/2018 at Unknown time  . folic acid (FOLVITE) 1 MG tablet Take 1 tablet  by mouth daily.   04/22/2018 at Unknown time  . metoprolol succinate (TOPROL-XL) 50 MG 24 hr tablet Take 50 mg by mouth daily.  3 04/23/2018 at Unknown time  . NIFEdipine (PROCARDIA-XL/ADALAT-CC/NIFEDICAL-XL) 30 MG 24 hr tablet Take 30 mg by mouth daily.   04/22/2018 at Unknown time  . pantoprazole (PROTONIX) 20 MG tablet Take 1 tablet (20 mg total) by mouth 2 (two) times daily before a meal. 60 tablet 0 04/23/2018 at Unknown time  . Prenatal Vit-Fe Fumarate-FA (PRENATAL MULTIVITAMIN) TABS tablet Take 1 tablet by mouth daily at 12 noon.   04/22/2018 at Unknown time  . sucralfate (CARAFATE) 1 g tablet Take 1 tablet (1 g total) by mouth 4 (four) times daily -  with meals and at bedtime. (Patient not taking: Reported on 04/23/2018) 30 tablet 0 Not Taking at Unknown time    Review of Systems  Constitutional: Negative.   HENT: Negative.   Eyes: Negative.   Respiratory: Negative.   Cardiovascular: Negative.   Gastrointestinal: Negative.   Endocrine: Negative.   Genitourinary: Positive for pelvic pain (UC's every 2-3 mins).  Musculoskeletal: Negative.   Skin: Negative.   Allergic/Immunologic: Negative.   Neurological: Negative.   Hematological: Negative.   Psychiatric/Behavioral: Negative.  Physical Exam   Blood pressure (!) 141/83, pulse 90, temperature 98.1 F (36.7 C), resp. rate 16, height 5\' 3"  (1.6 m), weight 79.4 kg.  Physical Exam  Nursing note and vitals reviewed. Constitutional: She is oriented to person, place, and time. She appears well-developed and well-nourished.  HENT:  Head: Normocephalic and atraumatic.  Eyes: Pupils are equal, round, and reactive to light.  Neck: Normal range of motion.  Cardiovascular: Normal rate and regular rhythm.  Respiratory: Effort normal.  GI: Soft.  Genitourinary:    Vagina and uterus normal.   Musculoskeletal: Normal range of motion.  Neurological: She is alert and oriented to person, place, and time.  Skin: Skin is warm and dry.  Psychiatric:  She has a normal mood and affect. Her behavior is normal. Judgment and thought content normal.   MAU Course  Procedures  MDM LR 1000 ml bolus CEFM Toco only -- UC's every 2-3 mins Terbutaline 0.25 mg SQ -- spacing out UC's  FHTs by doppler: (A) 187 bpm, (B) 146 bpm  Reassessment 1 hr after Indocin dose -- pt states she is "feeling very light-headed and woozy". She states that this is not a new issue. She states that she occasionally feels "dizzy & light-headed" just like now at home. She states this happened in her last pregnancy and she had to stop taking her BP medications.  *C  Assessment and Plan  Preterm uterine contractions in third trimester, antepartum - Admit to HROB unit  - Continue daily medications - Indocin 25 mg every 6 hrs po  - Repeat CL on U/S in the AM     Lenoard AdenAAVON,Jenny Omdahl J, MSN, CNM 04/23/2018, 12:32 PM

## 2018-04-24 ENCOUNTER — Observation Stay (HOSPITAL_COMMUNITY): Payer: 59

## 2018-04-24 MED ORDER — NIFEDIPINE ER OSMOTIC RELEASE 30 MG PO TB24: 30.0000 mg | ORAL_TABLET | Freq: Two times a day (BID) | ORAL | 3 refills | Status: DC

## 2018-04-24 MED ORDER — INDOMETHACIN 25 MG PO CAPS: 25.0000 mg | ORAL_CAPSULE | ORAL | 0 refills | Status: AC

## 2018-04-24 NOTE — Progress Notes (Signed)
Patient ID: Loretta Dixon, female   DOB: [birth year], 38 y.o.   MRN: [id number] HD #1 [redacted]w[redacted]d DIDI  Twins PTL   S: Occ cramping, marked improvement Good FM , No bleeding, change in dc or LOF Dizziness improved. No CP or SOB O: BP 120/63   Pulse 74   Temp 98.4 F (36.9 C) (Oral)   Resp 18   Ht 5\' 3"  (1.6 m)   Wt 79.4 kg   SpO2 99%   BMI 31.00 kg/m   HEENT: nl Neck: supple Lgs: CTA CV: RRR Abd: gravid NT No CVAt Ext : neg cords VE deferred  Toco with UI , 1-2 contractions per hr sono CL today 2.5cm. Images reviewed  FHTs noted x 2  UA: neg  IMP 22w 1d didi twin gestation PTL stable on Indocin- doubt cervical insufficiency History of PTL and term VD History of PTD (36w) on [place] CHTN  P: DC home with PTL precautions Continue OOW and bedrest Finish Indocin x 48h DC toprol and inc Procardia XL 30 to bid Short term office fu

## 2018-04-25 LAB — CULTURE, OB URINE: Culture: NO GROWTH

## 2018-05-10 ENCOUNTER — Encounter (HOSPITAL_COMMUNITY): Payer: Self-pay | Admitting: *Deleted

## 2018-05-10 ENCOUNTER — Other Ambulatory Visit: Payer: Self-pay

## 2018-05-10 ENCOUNTER — Observation Stay (HOSPITAL_COMMUNITY)
Admission: AD | Admit: 2018-05-10 | Discharge: 2018-05-11 | DRG: 832 | Disposition: A | Discharge status: Home / Self Care | Payer: 59 | Attending: Obstetrics and Gynecology | Admitting: Obstetrics and Gynecology

## 2018-05-10 DIAGNOSIS — O10012 Pre-existing essential hypertension complicating pregnancy, second trimester: Secondary | ICD-10-CM | POA: Diagnosis present

## 2018-05-10 DIAGNOSIS — Z3A24 24 weeks gestation of pregnancy: Secondary | ICD-10-CM | POA: Diagnosis not present

## 2018-05-10 DIAGNOSIS — O4702 False labor before 37 completed weeks of gestation, second trimester: Secondary | ICD-10-CM | POA: Diagnosis present

## 2018-05-10 DIAGNOSIS — O26872 Cervical shortening, second trimester: Secondary | ICD-10-CM | POA: Diagnosis present

## 2018-05-10 DIAGNOSIS — Z3686 Encounter for antenatal screening for cervical length: Secondary | ICD-10-CM

## 2018-05-10 DIAGNOSIS — O30042 Twin pregnancy, dichorionic/diamniotic, second trimester: Secondary | ICD-10-CM | POA: Diagnosis present

## 2018-05-10 LAB — TYPE AND SCREEN
ABO/RH(D): A POS
Antibody Screen: NEGATIVE

## 2018-05-10 LAB — URINALYSIS, ROUTINE W REFLEX MICROSCOPIC
Bilirubin Urine: NEGATIVE
Glucose, UA: NEGATIVE mg/dL
HGB URINE DIPSTICK: NEGATIVE
Ketones, ur: 5 mg/dL — AB
Leukocytes, UA: NEGATIVE
Nitrite: NEGATIVE
Protein, ur: NEGATIVE mg/dL
Specific Gravity, Urine: 1.002 — ABNORMAL LOW (ref 1.005–1.030)
pH: 7 (ref 5.0–8.0)

## 2018-05-10 LAB — CBC
HCT: 33.5 % — ABNORMAL LOW (ref 36.0–46.0)
Hemoglobin: 10.6 g/dL — ABNORMAL LOW (ref 12.0–15.0)
MCH: 27.9 pg (ref 26.0–34.0)
MCHC: 31.6 g/dL (ref 30.0–36.0)
MCV: 88.2 fL (ref 80.0–100.0)
PLATELETS: 232 10*3/uL (ref 150–400)
RBC: 3.8 MIL/uL — ABNORMAL LOW (ref 3.87–5.11)
RDW: 16 % — ABNORMAL HIGH (ref 11.5–15.5)
WBC: 7.5 10*3/uL (ref 4.0–10.5)
nRBC: 0 % (ref 0.0–0.2)

## 2018-05-10 MED ORDER — CALCIUM CARBONATE ANTACID 500 MG PO CHEW: 2.0000 | CHEWABLE_TABLET | ORAL | Status: DC | PRN

## 2018-05-10 MED ORDER — ACETAMINOPHEN 325 MG PO TABS: 650.0000 mg | ORAL_TABLET | ORAL | Status: DC | PRN

## 2018-05-10 MED ORDER — INDOMETHACIN 25 MG PO CAPS
25.0000 mg | ORAL_CAPSULE | Freq: Four times a day (QID) | ORAL | Status: DC
  Administered 2018-05-10 – 2018-05-11 (×4): 25 mg via ORAL
  Filled 2018-05-10 (×7): qty 1

## 2018-05-10 MED ORDER — LACTATED RINGERS IV SOLN
INTRAVENOUS | Status: DC
  Administered 2018-05-10 – 2018-05-11 (×4): via INTRAVENOUS

## 2018-05-10 MED ORDER — INDOMETHACIN 50 MG RE SUPP
50.0000 mg | Freq: Once | RECTAL | Status: AC
  Administered 2018-05-10: 50 mg via RECTAL
  Filled 2018-05-10: qty 1

## 2018-05-10 MED ORDER — LACTATED RINGERS IV BOLUS
500.0000 mL | Freq: Once | INTRAVENOUS | Status: AC
  Administered 2018-05-10: 500 mL via INTRAVENOUS

## 2018-05-10 MED ORDER — ZOLPIDEM TARTRATE 5 MG PO TABS: 5.0000 mg | ORAL_TABLET | Freq: Every evening | ORAL | Status: DC | PRN

## 2018-05-10 MED ORDER — PRENATAL MULTIVITAMIN CH
1.0000 | ORAL_TABLET | ORAL | Status: DC
  Administered 2018-05-10: 1 via ORAL
  Filled 2018-05-10: qty 1

## 2018-05-10 MED ORDER — NIFEDIPINE ER OSMOTIC RELEASE 30 MG PO TB24
60.0000 mg | ORAL_TABLET | Freq: Every day | ORAL | Status: DC
  Administered 2018-05-10: 60 mg via ORAL
  Filled 2018-05-10: qty 2

## 2018-05-10 MED ORDER — TERBUTALINE SULFATE 1 MG/ML IJ SOLN
0.2500 mg | Freq: Once | INTRAMUSCULAR | Status: AC
  Administered 2018-05-10: 0.25 mg via SUBCUTANEOUS
  Filled 2018-05-10: qty 1

## 2018-05-10 MED ORDER — PRENATAL MULTIVITAMIN CH: 1.0000 | ORAL_TABLET | Freq: Every day | ORAL | Status: DC

## 2018-05-10 MED ORDER — DOCUSATE SODIUM 100 MG PO CAPS: 100.0000 mg | ORAL_CAPSULE | Freq: Every day | ORAL | Status: DC

## 2018-05-10 NOTE — H&P (Signed)
Loretta Dixon is Loretta 38 y.o. female with twin gestation @24  3/[redacted] wk gestation admitted due to persistent ctx despite [place] terbutaline and indocin. Pt has been drinking plenty of fluid.  PNC complicated by chronic HTN treated with procardia. Hx PTB on 17 OHP. Shortened cervix OB History    Gravida  4   Para  3   Term  2   Preterm  1   AB      Living  3     SAB      TAB      Ectopic      Multiple  0   Live Births  3          Past Medical History:  Diagnosis Date  . Anemia   . Candidiasis of mouth   . Galactorrhea not associated with childbirth   . Hypertension   . Vaginal Pap smear, abnormal    Past Surgical History:  Procedure Laterality Date  . DILATION AND CURETTAGE OF UTERUS  July 2013   Family History: family history includes Asthma in her maternal aunt and maternal grandmother; Hypertension in her father, maternal grandfather, maternal grandmother, and mother. Social History:  reports that she has never smoked. She has never used smokeless tobacco. She reports that she does not drink alcohol or use drugs.     Maternal Diabetes: No Genetic Screening: Normal Maternal Ultrasounds/Referrals: Normal Fetal Ultrasounds or other Referrals:  None Maternal Substance Abuse:  No Significant Maternal Medications:  Meds include: Other: procardia, 17 OHP Significant Maternal Lab Results:  None Other Comments:  IVF twins, HTN. shortened cervix. previous PTB  Review of Systems  All other systems reviewed and are negative.  History Dilation: Closed Effacement (%): Thick Exam by:: Dr. Cherly Hensen Blood pressure (!) 153/76, pulse (!) 121, temperature 98.9 F (37.2 C), temperature source Oral, resp. rate 20, SpO2 100 %, unknown if currently breastfeeding. Exam Physical Exam  Constitutional: She is oriented to person, place, and time. She appears well-nourished.  HENT:  Head: Atraumatic.  Eyes: EOM are normal.  Neck: Neck supple.  Respiratory: Effort normal.  GI: Soft.   Musculoskeletal:        General: No edema.  Neurological: She is alert and oriented to person, place, and time.  Skin: Skin is warm and dry.  Psychiatric: She has Loretta normal mood and affect.    Prenatal labs: ABO, Rh: --/--/Loretta POS (01/03 1235) Antibody: NEG (01/03 1235) Rubella:  immune RPR:   NR HBsAg:   Neg HIV:   neg GBS:   not done Tracing: ctx q 2 mins Assessment/Plan: PMC Twin( di-di) @ 24 3/7 weeks Chronic HTN on med Hx PTB  P) admit  IVF. Cont indocin. Cont procardia    Loretta Dixon Loretta Dixon 05/10/2018, 3:46 PM

## 2018-05-10 NOTE — MAU Provider Note (Signed)
History     Chief Complaint  Patient presents with  . Contractions   38 yo [id number] MF here with twin gestation @ 24 1/7 weeks complaining of painful regular contractions. (+) FM (-) vaginal bleeding.  Hx PTB on 17 OHP. Pt has been drinking plenty of fluid.   OB History    Gravida  4   Para  3   Term  2   Preterm  1   AB      Living  3     SAB      TAB      Ectopic      Multiple  0   Live Births  3           Past Medical History:  Diagnosis Date  . Anemia   . Candidiasis of mouth   . Galactorrhea not associated with childbirth   . Hypertension   . Vaginal Pap smear, abnormal     Past Surgical History:  Procedure Laterality Date  . DILATION AND CURETTAGE OF UTERUS  July 2013    Family History  Problem Relation Age of Onset  . Asthma Maternal Grandmother   . Hypertension Maternal Grandmother   . Asthma Maternal Aunt   . Hypertension Mother   . Hypertension Father   . Hypertension Maternal Grandfather     Social History   Tobacco Use  . Smoking status: Never Smoker  . Smokeless tobacco: Never Used  Substance Use Topics  . Alcohol use: No  . Drug use: No    Allergies:  Allergies  Allergen Reactions  . Labetalol Shortness Of Breath    Medications Prior to Admission  Medication Sig Dispense Refill Last Dose  . aspirin EC 81 MG tablet Take 81 mg by mouth daily.   05/10/2018 at Unknown time  . docusate sodium (COLACE) 100 MG capsule Take 100 mg by mouth daily.   05/10/2018 at Unknown time  . Doxylamine-Pyridoxine 10-10 MG TBEC Take 2 tablets by mouth at bedtime. Pt also takes one in the am and one at lunch.   05/10/2018 at Unknown time  . folic acid (FOLVITE) 1 MG tablet Take 1 tablet by mouth daily.   05/09/2018 at Unknown time  . hydroxyprogesterone caproate (MAKENA) 250 mg/mL OIL injection Inject 250 mg into the muscle once a week.   Past Week at Unknown time  . NIFEdipine (PROCARDIA-XL/NIFEDICAL-XL) 30 MG 24 hr tablet Take 1 tablet (30 mg  total) by mouth 2 (two) times daily. 60 tablet 3 05/10/2018 at Unknown time  . pantoprazole (PROTONIX) 20 MG tablet Take 1 tablet (20 mg total) by mouth 2 (two) times daily before a meal. 60 tablet 0 05/10/2018 at Unknown time  . Prenatal Vit-Fe Fumarate-FA (PRENATAL MULTIVITAMIN) TABS tablet Take 1 tablet by mouth daily at 12 noon.   05/09/2018 at Unknown time  . sucralfate (CARAFATE) 1 g tablet Take 1 tablet (1 g total) by mouth 4 (four) times daily -  with meals and at bedtime. (Patient not taking: Reported on 04/23/2018) 30 tablet 0 Not Taking at Unknown time     Physical Exam   Blood pressure (!) 153/76, pulse (!) 121, temperature 98.9 F (37.2 C), temperature source Oral, resp. rate 20, SpO2 100 %, unknown if currently breastfeeding.  General appearance: alert, cooperative and mild distress Lungs: clear to auscultation bilaterally Heart: regular rate and rhythm, S1, S2 normal, no murmur, click, rub or gallop Abdomen: gravid  Pelvic: closed/firm/OOP Extremities: no edema, redness or tenderness in  the calves or thighs   Tracing: fhr x 2 Ctx q 2 mins  ED Course  [facility] Twins( di-di) gestation@ 24 1/7 weeks Hx PTB Chronic HTN on procardia P) [place] terbutaline. Indocin PR. Oral hydration. u/a MDM   Serita Kyle, MD 3:41 PM 05/10/2018

## 2018-05-10 NOTE — MAU Note (Signed)
Presents with c/o ctxs that began this morning.  Currently taking Procardia.  Denies VB or LOF.  Reports +FM.

## 2018-05-10 NOTE — MAU Note (Signed)
Urine in lab 

## 2018-05-11 ENCOUNTER — Inpatient Hospital Stay (HOSPITAL_COMMUNITY): Payer: 59

## 2018-05-11 DIAGNOSIS — O30042 Twin pregnancy, dichorionic/diamniotic, second trimester: Secondary | ICD-10-CM | POA: Diagnosis not present

## 2018-05-11 DIAGNOSIS — O26872 Cervical shortening, second trimester: Secondary | ICD-10-CM

## 2018-05-11 DIAGNOSIS — Z3A24 24 weeks gestation of pregnancy: Secondary | ICD-10-CM

## 2018-05-11 LAB — SYPHILIS: RPR W/REFLEX TO RPR TITER AND TREPONEMAL ANTIBODIES, TRADITIONAL SCREENING AND DIAGNOSIS ALGORITHM: RPR Ser Ql: REACTIVE — AB

## 2018-05-11 LAB — RPR, QUANT+TP ABS (REFLEX)
Rapid Plasma Reagin, Quant: 1:1 {titer} — ABNORMAL HIGH
T Pallidum Abs: NONREACTIVE

## 2018-05-11 IMAGING — US US MFM OB TRANSVAGINAL
1 series · 14 of 28 positions shown · non-contrast
Comparison: none

[Series 1: us mfm ob transvaginal · 14 of 59 slices shown]
[im 3/59]
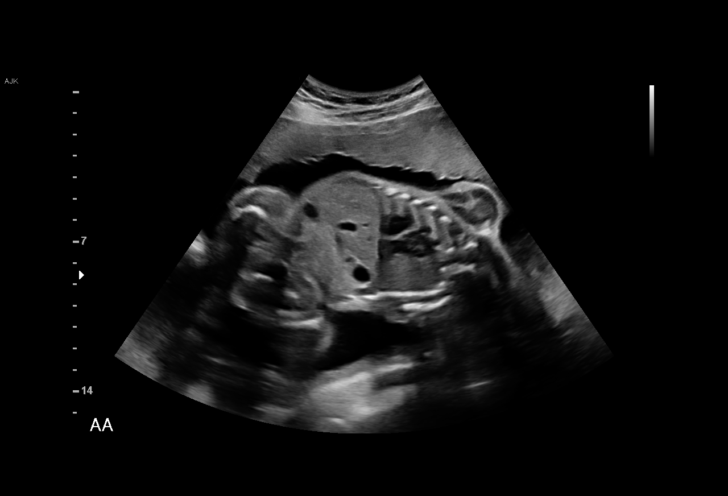
[im 7/59]
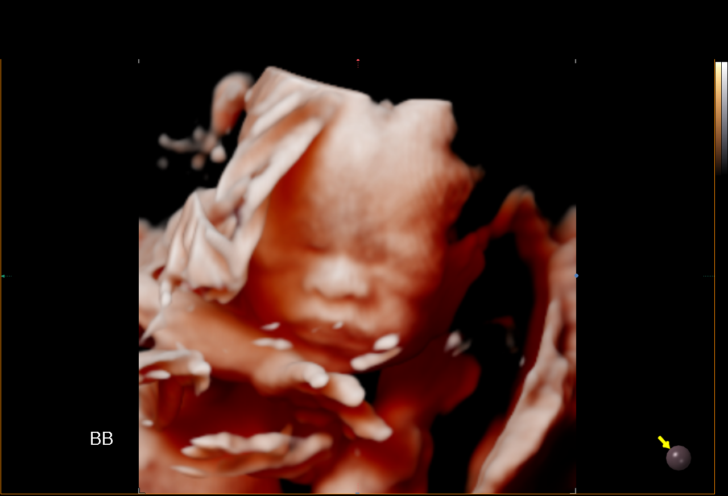
[im 11/59]
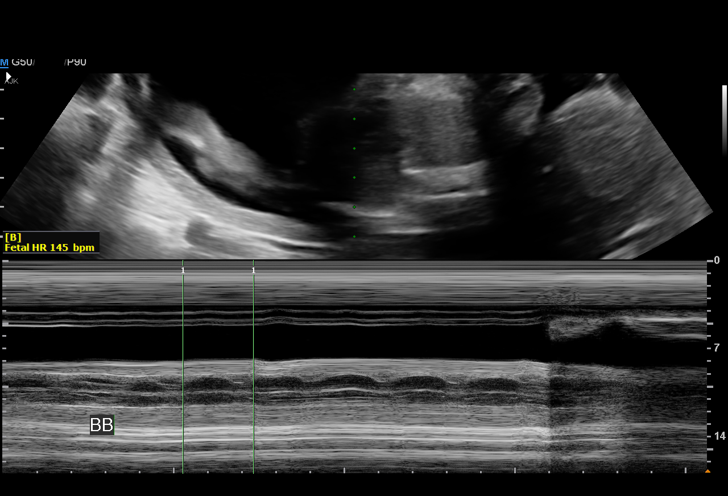
[im 16/59]
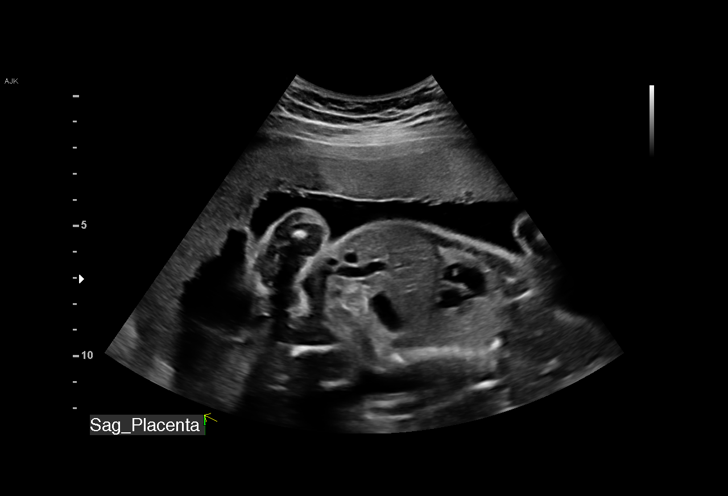
[im 20/59]
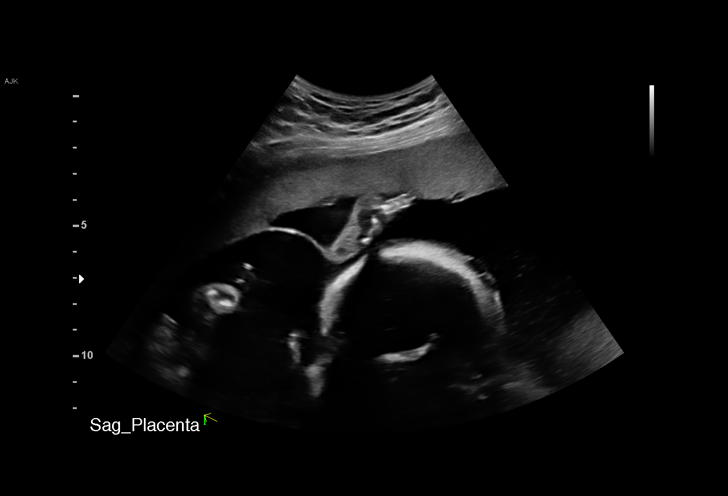
[im 24/59]
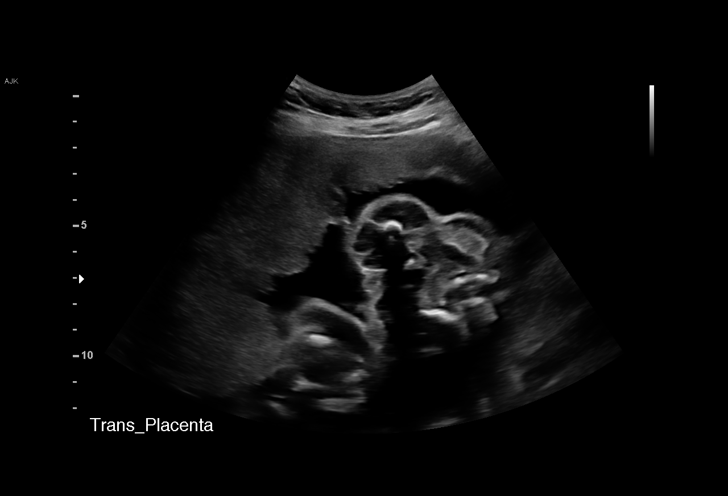
[im 28/59]
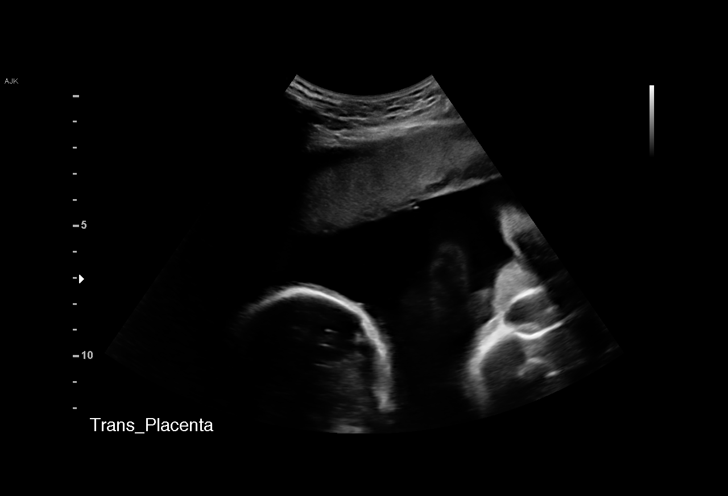
[im 33/59]
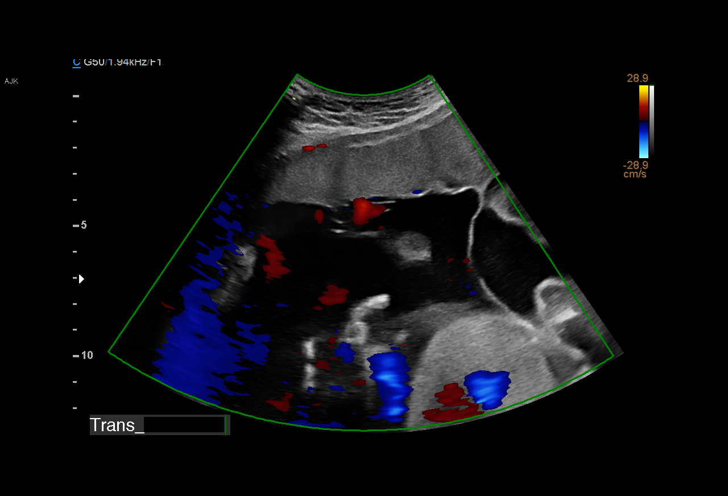
[im 37/59]
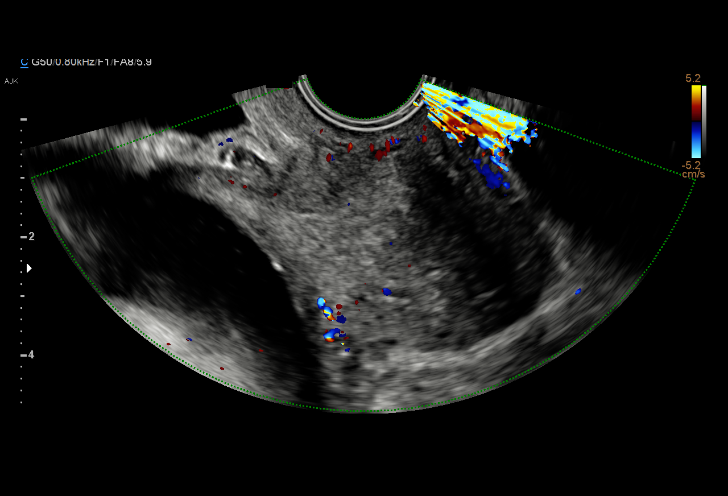
[im 41/59]
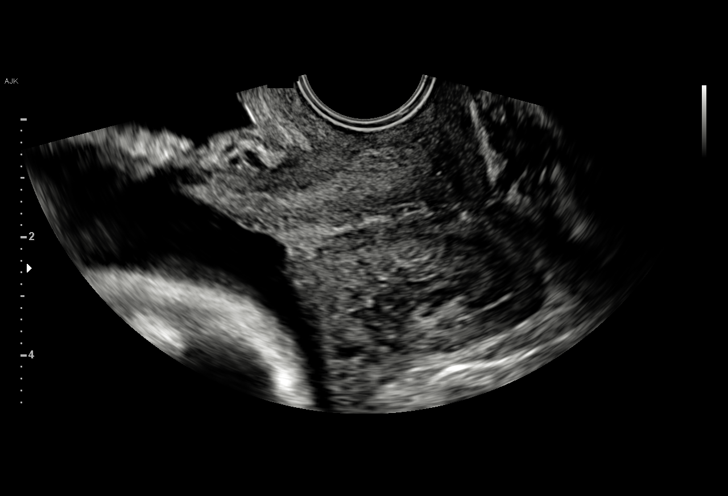
[im 46/59]
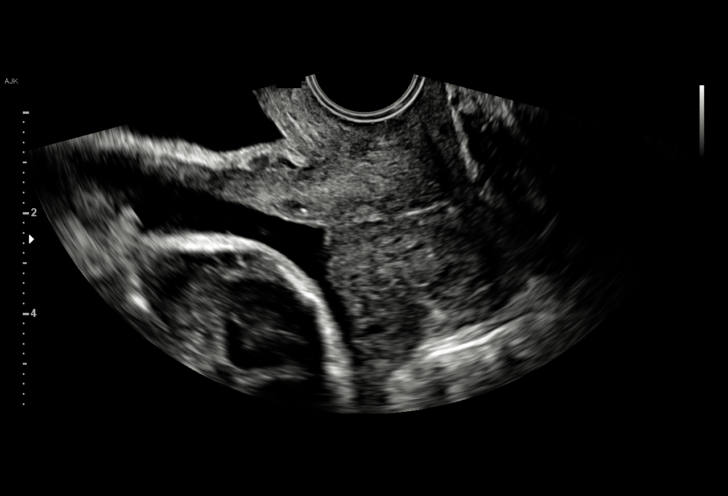
[im 50/59]
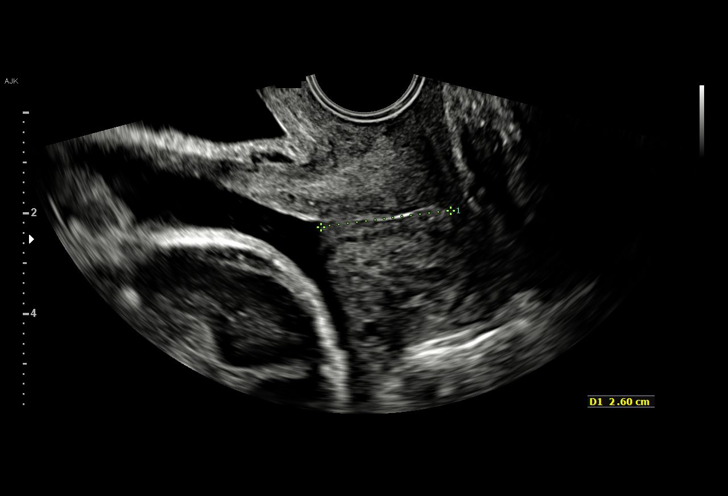
[im 54/59]
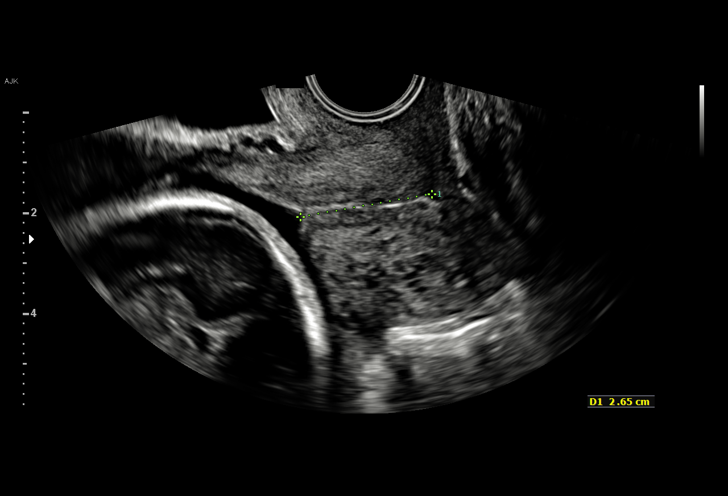
[im 59/59]
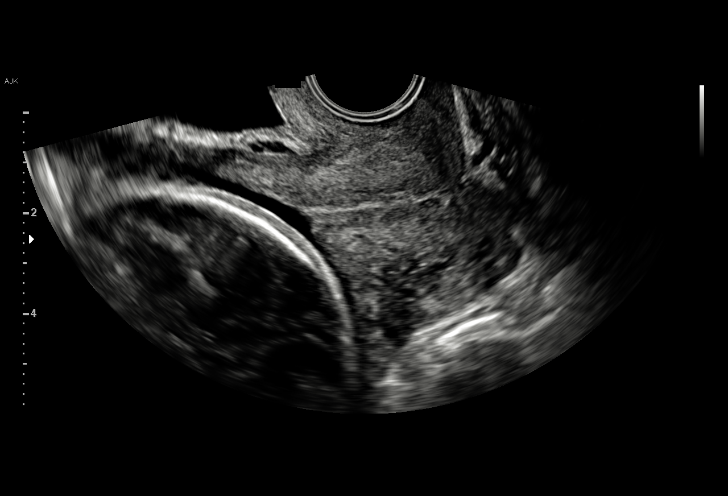

[14 of 28 positions shown; findings below may reference images not displayed]

Performed By:     [facility]          Secondary Phy.:    [facility]
                                                             [facility]
                                                             [facility]
                   MAU/Triage

  1  US MFM OB TRANSVAGINAL                76817.2     [facility]
                                                       [facility]
 ----------------------------------------------------------------------

 ----------------------------------------------------------------------
Indications

  24 weeks gestation of pregnancy
  Cervical shortening, second trimester           [R3]
  Twin pregnancy, di/di, second trimester         [R3]
 ----------------------------------------------------------------------
Fetal Evaluation (Fetus A)

 Num Of Fetuses:          2
 Fetal Heart              173
 Rate(bpm):
 Cardiac Activity:        Observed
 Fetal Lie:               Maternal left side lower
 Presentation:            Cephalic
 Placenta:                Anterior
 P. Cord Insertion:       Visualized, central

 Amniotic Fluid
 AFI FV:      Within normal limits

                             Largest Pocket(cm)


 Comment:    Stomach, diaphragm, and bladder noted
OB History

 Gravidity:    4         Term:   2        Prem:   1
 Living:       3
Gestational Age (Fetus A)

 LMP:           24w 4d        Date:  [DATE]                 EDD:    [DATE]
 Best:          24w 4d     Det. By:  LMP  ([DATE])          EDD:    [DATE]

Fetal Evaluation (Fetus B)

 Num Of Fetuses:          2
 Fetal Heart              145
 Rate(bpm):
 Cardiac Activity:        Observed
 Fetal Lie:               Maternal right side upper
 Presentation:            Cephalic
 Placenta:                Anterior
 P. Cord Insertion:       Visualized, central

 Amniotic Fluid
 AFI FV:      Within normal limits

                             Largest Pocket(cm)


 Comment:    Stomach, diaphragm, and bladder noted.
Gestational Age (Fetus B)

 LMP:           24w 4d        Date:  [DATE]                 EDD:    [DATE]
 Best:          24w 4d     Det. By:  LMP  ([DATE])          EDD:    [DATE]
Cervix Uterus Adnexa

 Cervix
 Length:           2.65  cm.
 Normal appearance by transvaginal scan

 Uterus
 No abnormality visualized.

 Left Ovary
 Not visualized.

 Right Ovary
 Not visualized.

 Adnexa
 No abnormality visualized.
Impression

 Twin pregnancy
 Stable cervical length.
Recommendations

 Clinical correlation recommended.
 No further CL indicated at this time.

## 2018-05-11 MED ORDER — INDOMETHACIN 25 MG PO CAPS: 25.0000 mg | ORAL_CAPSULE | Freq: Four times a day (QID) | ORAL | 0 refills | Status: DC

## 2018-05-11 MED ORDER — NIFEDIPINE ER 60 MG PO TB24: 60.0000 mg | ORAL_TABLET | Freq: Every day | ORAL | 8 refills | Status: AC

## 2018-05-11 NOTE — Progress Notes (Addendum)
S: painful ctx but now spaced S/p Indocin and [place] terbutaline  O: BP 128/70 (BP Location: Left Arm)   Pulse 92   Temp 98.7 F (37.1 C) (Oral)   Resp 18   Ht 5\' 3"  (1.6 m)   Wt 80.4 kg   SpO2 100%   BMI 31.40 kg/m    abd gravid non tender Pelvic deferred extr no edema  tracing: (+) FHR x 2 irreg ctx  IMP: PMC on indocin Twin gestation Hx PTB Chronic HTN on procardia P) cervical length by sonogram   Addendum: [place] Mfm Ob Transvaginal  Result Date: 05/11/2018 ----------------------------------------------------------------------  OBSTETRICS REPORT                       (Signed Final 05/11/2018 04:37 pm) ---------------------------------------------------------------------- Patient Info  ID #:       [id number]                          D.O.B.:  1981/03/04 (37 yrs)  Name:       Loretta Dixon                Visit Date: 05/11/2018 01:32 pm ---------------------------------------------------------------------- Performed By  Performed By:     Hurman Horn          Secondary Phy.:    Nena Jordan                    RDMS                                                              Haja Crego MD  Attending:        Lin Landsman      Address:           [facility]                    MD                                                              [facility]                                                              [facility]                                                              [address]  [place], [place]                                                              [address]  Referred By:      MAU Nursing-           Location:          [facility]                    MAU/Triage ---------------------------------------------------------------------- Orders   #  Description                           Code        Ordered By   1  [place] MFM OB TRANSVAGINAL                [id number]     Saphyra Hutt  ----------------------------------------------------------------------   #  Order #                     Accession #                Episode #   1  [id number]                   [id number]                 [id number]  ---------------------------------------------------------------------- Indications   [redacted] weeks gestation of pregnancy                 Z3A.24   Cervical shortening, second trimester           O26.872   Twin pregnancy, di/di, second trimester         O30.042  ---------------------------------------------------------------------- Fetal Evaluation (Fetus A)  Num Of Fetuses:          2  Fetal Heart              173  Rate(bpm):  Cardiac Activity:        Observed  Fetal Lie:               Maternal left side lower  Presentation:            Cephalic  Placenta:                Anterior  P. Cord Insertion:       Visualized, central  Amniotic Fluid  AFI FV:      Within normal limits                              Largest Pocket(cm)                              7.22  Comment:    Stomach, diaphragm, and bladder noted ---------------------------------------------------------------------- OB History  Gravidity:  4         Term:   2        Prem:   1  Living:       3 ---------------------------------------------------------------------- Gestational Age (Fetus A)  LMP:           24w 4d        Date:  11/20/17                 EDD:    08/27/18  Best:          Darien Ramus 4d     Det. By:  LMP  (11/20/17)          EDD:    08/27/18 ---------------------------------------------------------------------- Fetal Evaluation (Fetus B)  Num Of Fetuses:          2  Fetal Heart              145  Rate(bpm):  Cardiac Activity:        Observed  Fetal Lie:               Maternal right side upper  Presentation:            Cephalic  Placenta:                Anterior  P. Cord Insertion:       Visualized, central  Amniotic Fluid  AFI FV:      Within normal limits                              Largest Pocket(cm)                              6.22   Comment:    Stomach, diaphragm, and bladder noted. ---------------------------------------------------------------------- Gestational Age (Fetus B)  LMP:           24w 4d        Date:  11/20/17                 EDD:    08/27/18  Best:          24w 4d     Det. By:  LMP  (11/20/17)          EDD:    08/27/18 ---------------------------------------------------------------------- Cervix Uterus Adnexa  Cervix  Length:           2.65  cm.  Normal appearance by transvaginal scan  Uterus  No abnormality visualized.  Left Ovary  Not visualized.  Right Ovary  Not visualized.  Adnexa  No abnormality visualized. ---------------------------------------------------------------------- Impression  Twin pregnancy  Stable cervical length. ---------------------------------------------------------------------- Recommendations  Clinical correlation recommended.  No further CL indicated at this time. ----------------------------------------------------------------------               Lin Landsman, MD Electronically Signed Final Report   05/11/2018 04:37 pm ----------------------------------------------------------------------  [place] Mfm Ob Limited  Result Date: 04/25/2018 ----------------------------------------------------------------------  OBSTETRICS REPORT                        (Signed Final 04/25/2018 08:51 am) ---------------------------------------------------------------------- Patient Info  ID #:       [id number]                          D.O.B.:  1980-06-12 (37 yrs)  [facility]                Visit Date: 04/24/2018 08:42 am ---------------------------------------------------------------------- Performed By  Performed By:     Birdena CrandallYasemin Karatas        Secondary Phy.:    Nena JordanSHERONETTE                    RDMS,RVT                                                              Kinzy Weyers MD  Attending:        Lin Landsmanorenthian Booker      Address:           [facility]                    MD                                                               [facility]                                                              [facility]                                                              [address]                                                              [place], [place]                                                              [id number]  Referred By:      MAU Nursing-           [facility]                    MAU/Triage ---------------------------------------------------------------------- Orders   #  Description                          Code         Ordered By   1  US MFM OB LIMITED  06770.34     ROLITTA DAWSON  ----------------------------------------------------------------------   #  Order #                    Accession #                 Episode #   1  [id number]                  [id number]                  [id number]  ---------------------------------------------------------------------- Indications   Cervical shortening, second trimester          O26.872   Twin pregnancy, di/di, second trimester        O30.042   Poor obstetric history: Previous preterm       O09.219   delivery, antepartum   [redacted] weeks gestation of pregnancy                Z3A.22  ---------------------------------------------------------------------- Fetal Evaluation (Fetus A)  Num Of Fetuses:          2  Fetal Heart Rate(bpm):   143  Cardiac Activity:        Observed  Fetal Lie:               Maternal left side, lower  Presentation:            Transverse, head to maternal right  Placenta:                Anterior  P. Cord Insertion:       Previously Visualized  Membrane Desc:      Dividing Membrane seen  Amniotic Fluid  AFI FV:      Subjectively upper-normal                              Largest Pocket(cm)                              8.26  Comment:    Movments  visualized ---------------------------------------------------------------------- OB History  Gravidity:    4         Term:   2        Prem:   1  Living:       3  ---------------------------------------------------------------------- Gestational Age (Fetus A)  LMP:           22w 1d        Date:  11/20/17                 EDD:   08/27/18  Best:          22w 1d     Det. By:  LMP  (11/20/17)          EDD:   08/27/18 ---------------------------------------------------------------------- Anatomy (Fetus A)  Heart:                 Appears normal         Bladder:                Appears normal                         (4CH, axis, and situs  Stomach:               Appears normal, left  sided ---------------------------------------------------------------------- Fetal Evaluation (Fetus B)  Num Of Fetuses:          2  Fetal Heart Rate(bpm):   142  Cardiac Activity:        Observed  Fetal Lie:               Maternal right side, upper  Presentation:            Transverse, head to maternal left  Placenta:                Anterior  P. Cord Insertion:       Visualized, central  Membrane Desc:      Dividing Membrane seen  Amniotic Fluid  AFI FV:      Within normal limits                              Largest Pocket(cm)                              6.32  Comment:    Movments visualized. ---------------------------------------------------------------------- Gestational Age (Fetus B)  LMP:           22w 1d        Date:  11/20/17                 EDD:   08/27/18  Best:          22w 1d     Det. By:  LMP  (11/20/17)          EDD:   08/27/18 ---------------------------------------------------------------------- Anatomy (Fetus B)  Ventricles:            Appears normal         Bladder:                Appears normal  Stomach:               Appears normal, left                         sided ---------------------------------------------------------------------- Cervix Uterus Adnexa  Cervix  Length:           2.59  cm.  Normal appearance by transvaginal scan, Appears closed, without  funnelling.  Uterus  No abnormality visualized.  ---------------------------------------------------------------------- Impression  Cervical length 2.59  Twin pregnancy  Normal amniotic fluid. ---------------------------------------------------------------------- Recommendations  Follow up CL in 1 week ----------------------------------------------------------------------               Lin Landsmanorenthian Booker, MD Electronically Signed Final Report   04/25/2018 08:51 am ---------------------------------------------------------------------- given cervix unchanged, will d/c home  PTL prec. Complete indocin for 48 hrs total .  Keep sched  Office appt. Will keep procardia to 60mg  XL qd to be taken this pm.

## 2018-05-11 NOTE — Discharge Summary (Signed)
Physician Discharge Summary  Patient ID: Loretta Dixon MRN: [id number] DOB/AGE: [birth year] 38 y.o.  Admit date: 05/10/2018 Discharge date: 05/11/2018  Admission Diagnoses: premature contractions affecting pregnancy, 2nd trimester Twin gestation( di-di), hx preterm birth. Chronic HTN, IUP @ 24  3/7 weeks  Discharge Diagnoses: same Active Problems:   Premature uterine contractions, antepartum, second trimester   Discharged Condition: stable  Hospital Course: pt presented to [facility]U with c/o painful regular ctx. She was found to be contraction q 2 mins. Indocin and [place] terbutaline was given despite cervix closed/ thick/ pp oop. Despite the medication and IVF , pt continued to contract and was therefore admitted for observation. She was continued on her procardia XL as well as the indocin. Subsequently, ctx spaced by the next day then started to be somewhat painful. U/s ordered and cervical length of 2.6 cm was noted. It was therefore decided to send pt home and complete the indocin  Consults: None  Significant Diagnostic Studies: labs:  CBC Latest Ref Rng & Units 05/10/2018 01/30/2018 04/04/2014  WBC 4.0 - 10.5 K/uL 7.5 7.9 10.5  Hemoglobin 12.0 - 15.0 g/dL 10.6(L) 12.1 8.3(L)  Hematocrit 36.0 - 46.0 % 33.5(L) 37.9 24.9(L)  Platelets 150 - 400 K/uL 232 285 154   [place] Mfm Ob Transvaginal  Result Date: 05/11/2018 ----------------------------------------------------------------------  OBSTETRICS REPORT                       (Signed Final 05/11/2018 04:37 pm) ---------------------------------------------------------------------- Patient Info  ID #:       [id number]                          D.O.B.:  1980/06/08 (37 yrs)  Name:       Loretta Dixon                Visit Date: 05/11/2018 01:32 pm ---------------------------------------------------------------------- Performed By  Performed By:     Hurman Horn          Secondary Phy.:    Nena Jordan                    RDMS                                                               Drake Wuertz MD  Attending:        Lin Landsman      Address:           [facility]                    MD                                                              [facility]  [facility]                                                              [address]                                                              [place], [place]                                                              [address]  Referred By:      MAU Nursing-           Location:          [facility]                    MAU/Triage ---------------------------------------------------------------------- Orders   #  Description                           Code        Ordered By   1  [place] MFM OB TRANSVAGINAL                [phone]     Giuseppe Duchemin  ----------------------------------------------------------------------   #  Order #                     Accession #                Episode #   1  [id number]                   [id number]                 [id number]  ---------------------------------------------------------------------- Indications   [redacted] weeks gestation of pregnancy                 Z3A.24   Cervical shortening, second trimester           O26.872   Twin pregnancy, di/di, second trimester         O30.042  ---------------------------------------------------------------------- Fetal Evaluation (Fetus A)  Num Of Fetuses:          2  Fetal Heart              173  Rate(bpm):  Cardiac Activity:        Observed  Fetal Lie:               Maternal left side lower  Presentation:  Cephalic  Placenta:                Anterior  P. Cord Insertion:       Visualized, central  Amniotic Fluid  AFI FV:      Within normal limits                              Largest Pocket(cm)                              7.22  Comment:    Stomach, diaphragm, and bladder noted  ---------------------------------------------------------------------- OB History  Gravidity:    4         Term:   2        Prem:   1  Living:       3 ---------------------------------------------------------------------- Gestational Age (Fetus A)  LMP:           24w 4d        Date:  11/20/17                 EDD:    08/27/18  Best:          24w 4d     Det. By:  LMP  (11/20/17)          EDD:    08/27/18 ---------------------------------------------------------------------- Fetal Evaluation (Fetus B)  Num Of Fetuses:          2  Fetal Heart              145  Rate(bpm):  Cardiac Activity:        Observed  Fetal Lie:               Maternal right side upper  Presentation:            Cephalic  Placenta:                Anterior  P. Cord Insertion:       Visualized, central  Amniotic Fluid  AFI FV:      Within normal limits                              Largest Pocket(cm)                              6.22  Comment:    Stomach, diaphragm, and bladder noted. ---------------------------------------------------------------------- Gestational Age (Fetus B)  LMP:           24w 4d        Date:  11/20/17                 EDD:    08/27/18  Best:          24w 4d     Det. By:  LMP  (11/20/17)          EDD:    08/27/18 ---------------------------------------------------------------------- Cervix Uterus Adnexa  Cervix  Length:           2.65  cm.  Normal appearance by transvaginal scan  Uterus  No abnormality visualized.  Left Ovary  Not visualized.  Right Ovary  Not visualized.  Adnexa  No abnormality visualized. ---------------------------------------------------------------------- Impression  Twin pregnancy  Stable cervical length. ---------------------------------------------------------------------- Recommendations  Clinical correlation recommended.  No further  CL indicated at this time. ----------------------------------------------------------------------               Lin Landsmanorenthian Booker, MD Electronically Signed Final Report    05/11/2018 04:37 pm ----------------------------------------------------------------------  [place] Mfm Ob Limited  Result Date: 04/25/2018 ----------------------------------------------------------------------  OBSTETRICS REPORT                        (Signed Final 04/25/2018 08:51 am) ---------------------------------------------------------------------- Patient Info  ID #:       [id number]                          D.O.B.:  1980-05-07 (37 yrs)  Name:       Loretta BlancoSHIMICA Plath                Visit Date: 04/24/2018 08:42 am ---------------------------------------------------------------------- Performed By  Performed By:     Birdena CrandallYasemin Karatas        Secondary Phy.:    Nena JordanSHERONETTE                    RDMS,RVT                                                              Cade Olberding MD  Attending:        Lin Landsmanorenthian Booker      Address:           [facility]                    MD                                                              [facility]                                                              [facility]                                                              [address]                                                              [place], [place]  [id number]  Referred By:      MAU Nursing-           [facility]                    MAU/Triage ---------------------------------------------------------------------- Orders   #  Description                          Code         Ordered By   1  US MFM OB LIMITED                    76815.01     ROLITTA DAWSON  ----------------------------------------------------------------------   #  Order #                    Accession #                 Episode #   1  [id number]                  [id number]                  [id number]  ---------------------------------------------------------------------- Indications   Cervical shortening, second trimester          O26.872   Twin pregnancy, di/di, second  trimester        O30.042   Poor obstetric history: Previous preterm       O09.219   delivery, antepartum   [redacted] weeks gestation of pregnancy                Z3A.22  ---------------------------------------------------------------------- Fetal Evaluation (Fetus A)  Num Of Fetuses:          2  Fetal Heart Rate(bpm):   143  Cardiac Activity:        Observed  Fetal Lie:               Maternal left side, lower  Presentation:            Transverse, head to maternal right  Placenta:                Anterior  P. Cord Insertion:       Previously Visualized  Membrane Desc:      Dividing Membrane seen  Amniotic Fluid  AFI FV:      Subjectively upper-normal                              Largest Pocket(cm)                              8.26  Comment:    Movments  visualized ---------------------------------------------------------------------- OB History  Gravidity:    4         Term:   2        Prem:   1  Living:       3 ---------------------------------------------------------------------- Gestational Age (Fetus A)  LMP:           22w 1d        Date:  11/20/17                 EDD:   08/27/18  Best:          Maudie Mercury22w 1d  Det. By:  LMP  (11/20/17)          EDD:   08/27/18 ---------------------------------------------------------------------- Anatomy (Fetus A)  Heart:                 Appears normal         Bladder:                Appears normal                         (4CH, axis, and situs  Stomach:               Appears normal, left                         sided ---------------------------------------------------------------------- Fetal Evaluation (Fetus B)  Num Of Fetuses:          2  Fetal Heart Rate(bpm):   142  Cardiac Activity:        Observed  Fetal Lie:               Maternal right side, upper  Presentation:            Transverse, head to maternal left  Placenta:                Anterior  P. Cord Insertion:       Visualized, central  Membrane Desc:      Dividing Membrane seen  Amniotic Fluid  AFI FV:      Within normal limits                               Largest Pocket(cm)                              6.32  Comment:    Movments visualized. ---------------------------------------------------------------------- Gestational Age (Fetus B)  LMP:           22w 1d        Date:  11/20/17                 EDD:   08/27/18  Best:          22w 1d     Det. By:  LMP  (11/20/17)          EDD:   08/27/18 ---------------------------------------------------------------------- Anatomy (Fetus B)  Ventricles:            Appears normal         Bladder:                Appears normal  Stomach:               Appears normal, left                         sided ---------------------------------------------------------------------- Cervix Uterus Adnexa  Cervix  Length:           2.59  cm.  Normal appearance by transvaginal scan, Appears closed, without  funnelling.  Uterus  No abnormality visualized. ---------------------------------------------------------------------- Impression  Cervical length 2.59  Twin pregnancy  Normal amniotic fluid. ---------------------------------------------------------------------- Recommendations  Follow up CL in 1 week ----------------------------------------------------------------------               Lin Landsman, MD Electronically Signed Final Report   04/25/2018  08:51 am ----------------------------------------------------------------------   Treatments: IV hydration and indocin, [place] terbutaline  Discharge Exam: Blood pressure 123/79, pulse (!) 104, temperature 98.6 F (37 C), temperature source Oral, resp. rate 18, height 5\' 3"  (1.6 m), weight 80.4 kg, SpO2 100 %, unknown if currently breastfeeding. General appearance: alert Resp: clear to auscultation bilaterally Cardio: regular rate and rhythm, S1, S2 normal, no murmur, click, rub or gallop GI: gravid nontender Extremities: no edema, redness or tenderness in the calves or thighs  Disposition: Discharge disposition: 01-Home or Self Care       Discharge  Instructions    Diet - low sodium heart healthy   Complete by:  As directed    Discharge instructions   Complete by:  As directed    Preterm labor prec   Increase activity slowly   Complete by:  As directed      Allergies as of 05/11/2018      Reactions   Labetalol Shortness Of Breath      Medication List    STOP taking these medications   sucralfate 1 g tablet Commonly known as:  CARAFATE     TAKE these medications   aspirin EC 81 MG tablet Take 81 mg by mouth daily.   docusate sodium 100 MG capsule Commonly known as:  COLACE Take 100 mg by mouth daily.   Doxylamine-Pyridoxine 10-10 MG Tbec Take 2 tablets by mouth at bedtime. Pt also takes one in the am and one at lunch.   folic acid 1 MG tablet Commonly known as:  FOLVITE Take 1 tablet by mouth daily.   hydroxyprogesterone caproate 250 mg/mL Oil injection Commonly known as:  MAKENA Inject 250 mg into the muscle once a week.   indomethacin 25 MG capsule Commonly known as:  INDOCIN Take 1 capsule (25 mg total) by mouth every 6 (six) hours.   NIFEdipine 60 MG 24 hr tablet Commonly known as:  ADALAT CC Take 1 tablet (60 mg total) by mouth daily. What changed:    medication strength  how much to take  when to take this   pantoprazole 20 MG tablet Commonly known as:  PROTONIX Take 1 tablet (20 mg total) by mouth 2 (two) times daily before a meal.   prenatal multivitamin Tabs tablet Take 1 tablet by mouth daily at 12 noon.      Follow-up Information    Maxie Better, MD Follow up.   Specialty:  Obstetrics and Gynecology Why:  this week for 17 OHP Contact information: [address] [place] [address] [phone]           Signed: Nena Jordan A Anastaisa Wooding 05/11/2018, 6:23 PM

## 2018-05-11 NOTE — Progress Notes (Signed)
Discharge instructions given to patient. Discussed preterm labor preventions, medication changes and follow up appointment with Dr. Cherly Hensen.  Patient verbalized understanding. IV removed at this time.

## 2018-05-11 NOTE — Progress Notes (Signed)
Dr. Cherly Hensen called to verify procardia order and notify of cxn. Procardia 60mg  to be given to patient at night. Pharmacy notified to change MAR times.

## 2018-06-04 ENCOUNTER — Inpatient Hospital Stay (HOSPITAL_COMMUNITY)
Admission: AD | Admit: 2018-06-04 | Discharge: 2018-06-04 | Disposition: A | Discharge status: Home / Self Care | Payer: 59 | Attending: Obstetrics & Gynecology | Admitting: Obstetrics & Gynecology

## 2018-06-04 ENCOUNTER — Encounter (HOSPITAL_COMMUNITY): Payer: Self-pay | Admitting: *Deleted

## 2018-06-04 ENCOUNTER — Other Ambulatory Visit: Payer: Self-pay

## 2018-06-04 DIAGNOSIS — Z3A28 28 weeks gestation of pregnancy: Secondary | ICD-10-CM | POA: Insufficient documentation

## 2018-06-04 DIAGNOSIS — O4703 False labor before 37 completed weeks of gestation, third trimester: Secondary | ICD-10-CM

## 2018-06-04 DIAGNOSIS — O30043 Twin pregnancy, dichorionic/diamniotic, third trimester: Secondary | ICD-10-CM

## 2018-06-04 DIAGNOSIS — O30003 Twin pregnancy, unspecified number of placenta and unspecified number of amniotic sacs, third trimester: Secondary | ICD-10-CM | POA: Insufficient documentation

## 2018-06-04 DIAGNOSIS — R109 Unspecified abdominal pain: Secondary | ICD-10-CM | POA: Diagnosis present

## 2018-06-04 DIAGNOSIS — O26873 Cervical shortening, third trimester: Secondary | ICD-10-CM | POA: Diagnosis not present

## 2018-06-04 DIAGNOSIS — O26872 Cervical shortening, second trimester: Secondary | ICD-10-CM

## 2018-06-04 DIAGNOSIS — O4702 False labor before 37 completed weeks of gestation, second trimester: Secondary | ICD-10-CM

## 2018-06-04 DIAGNOSIS — Z7982 Long term (current) use of aspirin: Secondary | ICD-10-CM | POA: Diagnosis not present

## 2018-06-04 LAB — URINALYSIS, ROUTINE W REFLEX MICROSCOPIC
Bilirubin Urine: NEGATIVE
Glucose, UA: NEGATIVE mg/dL
Hgb urine dipstick: NEGATIVE
Ketones, ur: NEGATIVE mg/dL
Leukocytes,Ua: NEGATIVE
Nitrite: NEGATIVE
Protein, ur: NEGATIVE mg/dL
Specific Gravity, Urine: <1.005 — ABNORMAL LOW (ref 1.005–1.030)
pH: 5.5 (ref 5.0–8.0)

## 2018-06-04 LAB — FETAL FIBRONECTIN: Fetal Fibronectin: NEGATIVE

## 2018-06-04 MED ORDER — LACTATED RINGERS IV SOLN
INTRAVENOUS | Status: DC
  Administered 2018-06-04: 04:00:00 via INTRAVENOUS

## 2018-06-04 MED ORDER — LACTATED RINGERS IV SOLN
INTRAVENOUS | Status: DC
  Administered 2018-06-04: 05:00:00 via INTRAVENOUS

## 2018-06-04 NOTE — Discharge Instructions (Signed)
Preterm Labor and Birth Information °Pregnancy normally lasts 39-41 weeks. Preterm labor is when labor starts early. It starts before you have been pregnant for 37 whole weeks. °What are the risk factors for preterm labor? °Preterm labor is more likely to occur in women who: °· Have an infection while pregnant. °· Have a cervix that is short. °· Have gone into preterm labor before. °· Have had surgery on their cervix. °· Are younger than age 39. °· Are older than age 35. °· Are African American. °· Are pregnant with two or more babies. °· Take street drugs while pregnant. °· Smoke while pregnant. °· Do not gain enough weight while pregnant. °· Got pregnant right after another pregnancy. °What are the symptoms of preterm labor? °Symptoms of preterm labor include: °· Cramps. The cramps may feel like the cramps some women get during their period. The cramps may happen with watery poop (diarrhea). °· Pain in the belly (abdomen). °· Pain in the lower back. °· Regular contractions or tightening. It may feel like your belly is getting tighter. °· Pressure in the lower belly that seems to get stronger. °· More fluid (discharge) leaking from the vagina. The fluid may be watery or bloody. °· Water breaking. °Why is it important to notice signs of preterm labor? °Babies who are born early may not be fully developed. They have a higher chance for: °· Long-term heart problems. °· Long-term lung problems. °· Trouble controlling body systems, like breathing. °· Bleeding in the brain. °· A condition called cerebral palsy. °· Learning difficulties. °· Death. °These risks are highest for babies who are born before 34 weeks of pregnancy. °How is preterm labor treated? °Treatment depends on: °· How long you were pregnant. °· Your condition. °· The health of your baby. °Treatment may involve: °· Having a stitch (suture) placed in your cervix. When you give birth, your cervix opens so the baby can come out. The stitch keeps the cervix  from opening too soon. °· Staying at the hospital. °· Taking or getting medicines, such as: °? Hormone medicines. °? Medicines to stop contractions. °? Medicines to help the baby’s lungs develop. °? Medicines to prevent your baby from having cerebral palsy. °What should I do if I am in preterm labor? °If you think you are going into labor too soon, call your doctor right away. °How can I prevent preterm labor? °· Do not use any tobacco products. °? Examples of these are cigarettes, chewing tobacco, and e-cigarettes. °? If you need help quitting, ask your doctor. °· Do not use street drugs. °· Do not use any medicines unless you ask your doctor if they are safe for you. °· Talk with your doctor before taking any herbal supplements. °· Make sure you gain enough weight. °· Watch for infection. If you think you might have an infection, get it checked right away. °· If you have gone into preterm labor before, tell your doctor. °This information is not intended to replace advice given to you by your health care provider. Make sure you discuss any questions you have with your health care provider. °Document Released: 07/04/2008 Document Revised: 09/18/2015 Document Reviewed: 08/29/2015 °Elsevier Interactive Patient Education © 2019 [employer] ° °

## 2018-06-04 NOTE — MAU Note (Signed)
Pt reports to MAU c/o ctx since 2100 every 4-10 min. +FM. No bleeding or LOF.

## 2018-06-04 NOTE — MAU Provider Note (Signed)
Chief Complaint:  Abdominal Pain   First Provider Initiated Contact with Patient 06/04/18 0234      HPI: Loretta Dixon is a 38 y.o. [id number] at 93w0dwho presents to maternity admissions reporting preterm contractions.  States they started before midnight and usually they stop but tonight they persisted.  Was not able to sleep.  . She reports good fetal movement, denies LOF, vaginal bleeding, vaginal itching/burning, urinary symptoms, h/a, dizziness, n/v, diarrhea, constipation or fever/chills.    Is on Procardia XL 30mg .every evening (9pm).  Was on 60mg  but had problems with Heart rate and her Toprol was restarted and procardia dose decreased.    Previously seen on  05/10/18 by Dr Cherly Hensen and treated with Terbutaline and Indocin.   Past Medical History: Past Medical History:  Diagnosis Date  . Anemia   . Candidiasis of mouth   . Galactorrhea not associated with childbirth   . Hypertension   . Vaginal Pap smear, abnormal     Past obstetric history: OB History  Gravida Para Term Preterm AB Living  4 3 2 1   3   SAB TAB Ectopic Multiple Live Births        0 3    # Outcome Date GA Lbr Len/2nd Weight Sex Delivery Anes PTL Lv  4 Current           3 Term 04/03/14 [redacted]w[redacted]d 13:26 / 00:06 2637 g F Vag-Spont None  LIV  2 Term     F    LIV  1 Preterm     F   Y LIV    Past Surgical History: Past Surgical History:  Procedure Laterality Date  . DILATION AND CURETTAGE OF UTERUS  July 2013    Family History: Family History  Problem Relation Age of Onset  . Asthma Maternal Grandmother   . Hypertension Maternal Grandmother   . Asthma Maternal Aunt   . Hypertension Mother   . Hypertension Father   . Hypertension Maternal Grandfather     Social History: Social History   Tobacco Use  . Smoking status: Never Smoker  . Smokeless tobacco: Never Used  Substance Use Topics  . Alcohol use: No  . Drug use: No    Allergies:  Allergies  Allergen Reactions  . Labetalol Shortness Of  Breath    Meds:  Medications Prior to Admission  Medication Sig Dispense Refill Last Dose  . aspirin EC 81 MG tablet Take 81 mg by mouth daily.   05/10/2018 at Unknown time  . docusate sodium (COLACE) 100 MG capsule Take 100 mg by mouth daily.   05/10/2018 at Unknown time  . Doxylamine-Pyridoxine 10-10 MG TBEC Take 2 tablets by mouth at bedtime. Pt also takes one in the am and one at lunch.   05/10/2018 at Unknown time  . folic acid (FOLVITE) 1 MG tablet Take 1 tablet by mouth daily.   05/09/2018 at Unknown time  . hydroxyprogesterone caproate (MAKENA) 250 mg/mL OIL injection Inject 250 mg into the muscle once a week.   Past Week at Unknown time  . indomethacin (INDOCIN) 25 MG capsule Take 1 capsule (25 mg total) by mouth every 6 (six) hours. 4 capsule 0   . NIFEdipine (ADALAT CC) 60 MG 24 hr tablet Take 1 tablet (60 mg total) by mouth daily. 30 tablet 8   . pantoprazole (PROTONIX) 20 MG tablet Take 1 tablet (20 mg total) by mouth 2 (two) times daily before a meal. 60 tablet 0 05/10/2018 at Unknown time  .  Prenatal Vit-Fe Fumarate-FA (PRENATAL MULTIVITAMIN) TABS tablet Take 1 tablet by mouth daily at 12 noon.   05/09/2018 at Unknown time    I have reviewed patient's Past Medical Hx, Surgical Hx, Family Hx, Social Hx, medications and allergies.   ROS:  Review of Systems  Constitutional: Negative for chills and fever.  Respiratory: Negative for shortness of breath.   Gastrointestinal: Positive for abdominal pain. Negative for constipation, diarrhea, nausea and vomiting.  Genitourinary: Positive for pelvic pain. Negative for vaginal bleeding and vaginal discharge.  Musculoskeletal: Negative for back pain.   Other systems negative  Physical Exam  No data found. Constitutional: Well-developed, well-nourished female in no acute distress.  Cardiovascular: normal rate and rhythm Respiratory: normal effort, clear to auscultation bilaterally GI: Abd soft, non-tender, gravid appropriate for  gestational age.   No rebound or guarding. MS: Extremities nontender, no edema, normal ROM Neurologic: Alert and oriented x 4.  GU: Neg CVAT.  PELVIC EXAM:    Dilation: Fingertip Effacement (%): 40 Station: Ballotable Presentation: Vertex Exam by:: Artelia Laroche     cnm  FHT:  Baseline 140s both twins , moderate variability, accelerations present, no decelerations Contractions: q 2-4 mins Irregular    Labs: Results for orders placed or performed during the hospital encounter of 06/04/18 (from the past 24 hour(s))  Urinalysis, Routine w reflex microscopic     Status: Abnormal   Collection Time: 06/04/18  2:53 AM  Result Value Ref Range   Color, Urine YELLOW YELLOW   APPearance CLEAR CLEAR   Specific Gravity, Urine <1.005 (L) 1.005 - 1.030   pH 5.5 5.0 - 8.0   Glucose, UA NEGATIVE NEGATIVE mg/dL   Hgb urine dipstick NEGATIVE NEGATIVE   Bilirubin Urine NEGATIVE NEGATIVE   Ketones, ur NEGATIVE NEGATIVE mg/dL   Protein, ur NEGATIVE NEGATIVE mg/dL   Nitrite NEGATIVE NEGATIVE   Leukocytes,Ua NEGATIVE NEGATIVE  Fetal fibronectin     Status: None   Collection Time: 06/04/18  3:20 AM  Result Value Ref Range   Fetal Fibronectin NEGATIVE NEGATIVE    --/--/A POS (01/20 1605)  Imaging:    MAU Course/MDM: 8882:  I have ordered labs and reviewed results. Fetal fibronectin is negative.  UA is clear and dilute.  NST reviewed and is reassuring.  Consult Dr Earlene Plater with presentation, exam findings and test results.  Treatments in MAU included IV fluid bolus x one liter.  Does not recommend Terbutaline.  Pt not given Procardia due to having her dose recently   Recheck of cervix is unchanged.  Continued to have contractions so Dr Earlene Plater reconsulted.  Recommends observation and IV @ 18ml/hr.  Reasses in 2 hrs.  Marland Kitchen    0730:   UCs have spaced out to every 7 minutes now.  Less uncomfortable   Will discharge home per consult Dr Earlene Plater.  Follow closely with office.   Assessment: Twin pregnancy  at [redacted]w[redacted]d Preterm uterine contractions with negative fetal fibronectin.  Plan: Discharge home Preterm labor precautions Follow up in office Encouraged to return here or to other Urgent Care/ED if she develops worsening of symptoms, increase in pain, fever, or other concerning symptoms.    Wynelle Bourgeois CNM, MSN Certified Nurse-Midwife 06/04/2018 2:34 AM

## 2018-06-16 ENCOUNTER — Encounter (HOSPITAL_COMMUNITY): Payer: Self-pay

## 2018-06-16 ENCOUNTER — Observation Stay (HOSPITAL_COMMUNITY)
Admission: AD | Admit: 2018-06-16 | Discharge: 2018-06-17 | Disposition: A | Discharge status: Home / Self Care | Payer: No Typology Code available for payment source | Attending: Obstetrics and Gynecology | Admitting: Obstetrics and Gynecology

## 2018-06-16 DIAGNOSIS — K219 Gastro-esophageal reflux disease without esophagitis: Secondary | ICD-10-CM | POA: Insufficient documentation

## 2018-06-16 DIAGNOSIS — O30043 Twin pregnancy, dichorionic/diamniotic, third trimester: Principal | ICD-10-CM | POA: Insufficient documentation

## 2018-06-16 DIAGNOSIS — O4703 False labor before 37 completed weeks of gestation, third trimester: Secondary | ICD-10-CM | POA: Diagnosis present

## 2018-06-16 DIAGNOSIS — O99613 Diseases of the digestive system complicating pregnancy, third trimester: Secondary | ICD-10-CM | POA: Diagnosis not present

## 2018-06-16 DIAGNOSIS — Z3A29 29 weeks gestation of pregnancy: Secondary | ICD-10-CM | POA: Insufficient documentation

## 2018-06-16 DIAGNOSIS — O163 Unspecified maternal hypertension, third trimester: Secondary | ICD-10-CM | POA: Diagnosis not present

## 2018-06-16 LAB — CBC
HCT: 33 % — ABNORMAL LOW (ref 36.0–46.0)
Hemoglobin: 10.2 g/dL — ABNORMAL LOW (ref 12.0–15.0)
MCH: 26.2 pg (ref 26.0–34.0)
MCHC: 30.9 g/dL (ref 30.0–36.0)
MCV: 84.6 fL (ref 80.0–100.0)
PLATELETS: 251 10*3/uL (ref 150–400)
RBC: 3.9 MIL/uL (ref 3.87–5.11)
RDW: 14.4 % (ref 11.5–15.5)
WBC: 9.4 10*3/uL (ref 4.0–10.5)
nRBC: 0 % (ref 0.0–0.2)

## 2018-06-16 MED ORDER — METOPROLOL SUCCINATE ER 50 MG PO TB24
50.0000 mg | ORAL_TABLET | Freq: Every day | ORAL | Status: DC
  Administered 2018-06-17: 50 mg via ORAL
  Filled 2018-06-16: qty 1

## 2018-06-16 MED ORDER — ZOLPIDEM TARTRATE 5 MG PO TABS: 5.0000 mg | ORAL_TABLET | Freq: Every evening | ORAL | Status: DC | PRN

## 2018-06-16 MED ORDER — MAGNESIUM SULFATE 40 G IN LACTATED RINGERS - SIMPLE
2.0000 g/h | INTRAVENOUS | Status: AC
  Administered 2018-06-17: 2 g/h via INTRAVENOUS

## 2018-06-16 MED ORDER — LACTATED RINGERS IV SOLN
INTRAVENOUS | Status: DC
  Administered 2018-06-17 (×2): via INTRAVENOUS

## 2018-06-16 MED ORDER — DOCUSATE SODIUM 100 MG PO CAPS
100.0000 mg | ORAL_CAPSULE | Freq: Every day | ORAL | Status: DC
  Administered 2018-06-17: 100 mg via ORAL
  Filled 2018-06-16: qty 1

## 2018-06-16 MED ORDER — ACETAMINOPHEN 325 MG PO TABS: 650.0000 mg | ORAL_TABLET | ORAL | Status: DC | PRN

## 2018-06-16 MED ORDER — CALCIUM CARBONATE ANTACID 500 MG PO CHEW
2.0000 | CHEWABLE_TABLET | ORAL | Status: DC | PRN
  Administered 2018-06-17: 400 mg via ORAL
  Filled 2018-06-16: qty 2

## 2018-06-16 MED ORDER — NIFEDIPINE ER OSMOTIC RELEASE 30 MG PO TB24
30.0000 mg | ORAL_TABLET | Freq: Once | ORAL | Status: DC
  Filled 2018-06-16: qty 1

## 2018-06-16 MED ORDER — MAGNESIUM SULFATE BOLUS VIA INFUSION
4.0000 g | Freq: Once | INTRAVENOUS | Status: AC
  Administered 2018-06-17: 4 g via INTRAVENOUS
  Filled 2018-06-16: qty 500

## 2018-06-16 MED ORDER — HYDROXYPROGESTERONE CAPROATE 250 MG/ML IM OIL
250.0000 mg | TOPICAL_OIL | INTRAMUSCULAR | Status: DC
  Administered 2018-06-17: 250 mg via INTRAMUSCULAR

## 2018-06-16 MED ORDER — PRENATAL MULTIVITAMIN CH
1.0000 | ORAL_TABLET | Freq: Every day | ORAL | Status: DC
  Administered 2018-06-17: 1 via ORAL
  Filled 2018-06-16: qty 1

## 2018-06-16 MED ORDER — PANTOPRAZOLE SODIUM 40 MG PO TBEC
40.0000 mg | DELAYED_RELEASE_TABLET | Freq: Every day | ORAL | Status: DC
  Administered 2018-06-17: 40 mg via ORAL
  Filled 2018-06-16: qty 1

## 2018-06-16 NOTE — H&P (Signed)
Loretta Dixon is a 38 y.o. female presenting @ 29 5/7 weeks Di-Di twin gestation with [facility]. BMZ complete. Hx PTB on Makena( due Fri). Pt has had several episodes of [facility] for which she was given procardia as well as indocin. Pt has persistent ctx q 3-4 mins and now placed inpt for magnesium sulfate.  Concordant growth on last office sono OB History    Gravida  4   Para  3   Term  2   Preterm  1   AB      Living  3     SAB      TAB      Ectopic      Multiple  0   Live Births  3          Past Medical History:  Diagnosis Date  . Anemia   . Candidiasis of mouth   . Galactorrhea not associated with childbirth   . Hypertension   . Vaginal Pap smear, abnormal    Past Surgical History:  Procedure Laterality Date  . DILATION AND CURETTAGE OF UTERUS  July 2013   Family History: family history includes Asthma in her maternal aunt and maternal grandmother; Hypertension in her father, maternal grandfather, maternal grandmother, and mother. Social History:  reports that she has never smoked. She has never used smokeless tobacco. She reports that she does not drink alcohol or use drugs.     Maternal Diabetes: No Genetic Screening: Declined low level NIFT declines repeat Maternal Ultrasounds/Referrals: Normal Fetal Ultrasounds or other Referrals:  None Maternal Substance Abuse:  No Significant Maternal Medications:  Meds include: Other: toprol, procardia, protonix, makena Significant Maternal Lab Results:  Lab values include: Other: false positive RPR Other Comments:  hx PTB, twin gestation, GERD  Review of Systems  All other systems reviewed and are negative.  Maternal Medical History:  Reason for admission: Contractions.   Contractions: Onset was 3-5 hours ago.   Perceived severity is moderate.    Fetal activity: Perceived fetal activity is normal.   Last perceived fetal movement was within the past hour.    Prenatal complications: PIH.   Prenatal Complications -  Diabetes: none.    Dilation: Fingertip Effacement (%): 40 Station: Ballotable Exam by:: [employer], RN Blood pressure 133/83, pulse 87, temperature 98.5 F (36.9 C), resp. rate 19, SpO2 100 %, unknown if currently breastfeeding. Exam Physical Exam  Constitutional: She is oriented to person, place, and time. She appears well-developed and well-nourished.  HENT:  Head: Atraumatic.  Eyes: EOM are normal.  Neck: Neck supple.  Cardiovascular: Regular rhythm.  Respiratory: Effort normal.  GI: Soft.  Mild palp ctx  Musculoskeletal:        General: No edema.  Neurological: She is alert and oriented to person, place, and time.  Skin: Skin is warm and dry.    Prenatal labs: ABO, Rh: --/--/A POS (01/20 1605) Antibody: NEG (01/20 1605) Rubella:  Immune RPR: Reactive (01/20 1613) TPA neg HBsAg:   neg HIV:   neg GBS:   not done  Assessment/Plan: PMC  hx PTB on Makena Di-Di twin gestation( 29 5/7 weeks Chronic HTN on toprol, procardia GERD P) Observation. Routine labs. Magnesium sulfate for neuro prophylaxis and  PMC. Cont meds. Cont monitoring    Carlisle Enke A Rindy Kollman 06/16/2018, 10:51 PM

## 2018-06-16 NOTE — MAU Provider Note (Signed)
History     Chief Complaint  Patient presents with  . Contractions  yo G4P3 BF at 87 5/7 wks twin gestation presents with c/o painful ctx( q 2-5 mins) but lasting a minute. (+) FM No vaginal bleeding. Hx PTB on 17 OHP  OB History    Gravida  4   Para  3   Term  2   Preterm  1   AB      Living  3     SAB      TAB      Ectopic      Multiple  0   Live Births  3           Past Medical History:  Diagnosis Date  . Anemia   . Candidiasis of mouth   . Galactorrhea not associated with childbirth   . Hypertension   . Vaginal Pap smear, abnormal     Past Surgical History:  Procedure Laterality Date  . DILATION AND CURETTAGE OF UTERUS  July 2013    Family History  Problem Relation Age of Onset  . Asthma Maternal Grandmother   . Hypertension Maternal Grandmother   . Asthma Maternal Aunt   . Hypertension Mother   . Hypertension Father   . Hypertension Maternal Grandfather     Social History   Tobacco Use  . Smoking status: Never Smoker  . Smokeless tobacco: Never Used  Substance Use Topics  . Alcohol use: No  . Drug use: No    Allergies:  Allergies  Allergen Reactions  . Labetalol Shortness Of Breath    Medications Prior to Admission  Medication Sig Dispense Refill Last Dose  . aspirin EC 81 MG tablet Take 81 mg by mouth daily.   06/03/2018 at Unknown time  . docusate sodium (COLACE) 100 MG capsule Take 100 mg by mouth daily.   06/03/2018 at Unknown time  . Doxylamine-Pyridoxine 10-10 MG TBEC Take 2 tablets by mouth at bedtime. Pt also takes one in the am and one at lunch.   06/03/2018 at Unknown time  . folic acid (FOLVITE) 1 MG tablet Take 1 tablet by mouth daily.   06/03/2018 at Unknown time  . hydroxyprogesterone caproate (MAKENA) 250 mg/mL OIL injection Inject 250 mg into the muscle once a week.   06/03/2018 at Unknown time  . indomethacin (INDOCIN) 25 MG capsule Take 1 capsule (25 mg total) by mouth every 6 (six) hours. 4 capsule 0 Past Month at  Unknown time  . metoprolol succinate (TOPROL-XL) 50 MG 24 hr tablet Take 50 mg by mouth daily. Take with or immediately following a meal.   06/03/2018 at Unknown time  . NIFEdipine (ADALAT CC) 60 MG 24 hr tablet Take 1 tablet (60 mg total) by mouth daily. (Patient taking differently: Take 30 mg by mouth daily. ) 30 tablet 8 06/03/2018 at Unknown time  . pantoprazole (PROTONIX) 20 MG tablet Take 1 tablet (20 mg total) by mouth 2 (two) times daily before a meal. 60 tablet 0 06/03/2018 at Unknown time  . Prenatal Vit-Fe Fumarate-FA (PRENATAL MULTIVITAMIN) TABS tablet Take 1 tablet by mouth daily at 12 noon.   06/03/2018 at Unknown time     Physical Exam   unknown if currently breastfeeding.  General appearance: alert, cooperative and no distress Abdomen: gravid soft. mild palp ctx Pelvic: closed/thick/-3   Tracing: baseline 140 twin A Baseline 150 twin B Ctx q 2-4 mins  ED Course  [facility]  twin gestation @ 29 5/7 weeks  BMZ complete Hx PTB on 17 OHP Chronic HTn on topral, procardia  P) hydrate. U/a. Take procardia dose. Monitor. Recheck cervix  MDM   Serita Kyle, MD 7:13 PM 06/16/2018

## 2018-06-16 NOTE — MAU Note (Signed)
States she's been having ctx all day-now every 2-5 mins.  No LOF/VB.  + FM.

## 2018-06-17 ENCOUNTER — Encounter (HOSPITAL_COMMUNITY): Payer: Self-pay

## 2018-06-17 ENCOUNTER — Other Ambulatory Visit: Payer: Self-pay

## 2018-06-17 DIAGNOSIS — O4703 False labor before 37 completed weeks of gestation, third trimester: Secondary | ICD-10-CM | POA: Diagnosis present

## 2018-06-17 DIAGNOSIS — O30043 Twin pregnancy, dichorionic/diamniotic, third trimester: Secondary | ICD-10-CM | POA: Diagnosis not present

## 2018-06-17 LAB — ABO/RH: ABO/RH(D): A POS

## 2018-06-17 LAB — TYPE AND SCREEN
ABO/RH(D): A POS
Antibody Screen: NEGATIVE

## 2018-06-17 LAB — RPR: RPR Ser Ql: NONREACTIVE

## 2018-06-17 NOTE — Discharge Instructions (Signed)
Preterm labor precautions

## 2018-06-17 NOTE — Discharge Summary (Signed)
Physician Discharge Summary  Patient ID: Loretta Dixon MRN: [id number] DOB/AGE: May 02, 1980 38 y.o.  Admit date: 06/16/2018 Discharge date: 06/17/2018  Admission Diagnoses: [facility] [place],  Twin gestation( Di-DI) @ 29 5/7 weeks, GERD, hx PTB, chronic HTN   Discharge Diagnoses: same Active Problems:   Premature uterine contractions in third trimester, antepartum   Discharged Condition: stable   Hospital Course: pt present to MAU with complain of contractions. Pt was given fluid and took her procardia. Ctx persisted and pt was placed on observation. Magnesium sulfate was started . Ctx spaced and cervix remained unchanged. Pt had steroid in the past. Pt took her 17 OHP the next day.  Consults: None  Significant Diagnostic Studies: labs:  CBC    Component Value Date/Time   WBC 9.4 06/16/2018 2313   RBC 3.90 06/16/2018 2313   HGB 10.2 (L) 06/16/2018 2313   HCT 33.0 (L) 06/16/2018 2313   PLT 251 06/16/2018 2313   MCV 84.6 06/16/2018 2313   MCH 26.2 06/16/2018 2313   MCHC 30.9 06/16/2018 2313   RDW 14.4 06/16/2018 2313     Treatments: magnesium sulfate  Discharge Exam: Blood pressure 137/73, pulse 98, temperature 98.2 F (36.8 C), temperature source Oral, resp. rate 20, SpO2 100 %, unknown if currently breastfeeding. General appearance: alert, cooperative and no distress Resp: clear to auscultation bilaterally Cardio: regular rate and rhythm, S1, S2 normal, no murmur, click, rub or gallop GI: gravid soft Pelvic: ft/40/-3 Extremities: no edema, redness or tenderness in the calves or thighs  Disposition: home There are no questions and answers to display.          Follow-up Information    Maxie Better, MD Follow up in 1 week(s).   Specialty:  Obstetrics and Gynecology Contact information: [address] [place] [address] [phone]           Signed: Nena Jordan A Jathen Sudano 06/17/2018, 5:50 PM

## 2018-06-17 NOTE — Progress Notes (Signed)
HD #2  29 6/7 wk  PMC S/p magnesium sulfate  S: notes ctx but not painful and have spaced  O: BP 137/73   Pulse 98   Temp 98.2 F (36.8 C) (Oral)   Resp 20   SpO2 100%  VE ft/thick/-3  Tracing: baseline 130 x2 ( twin A/B)  occ ctx  IMP: PMC improved after mangesium sulfate Twin gestation ( Di-DI) @ 29 6/7 weeks Chronic HTN on med Hx PTB on makena P) makena now( pt has own med). D/c home. F/u next week PTL prec.

## 2018-06-19 LAB — CULTURE, BETA STREP (GROUP B ONLY)

## 2018-06-28 ENCOUNTER — Other Ambulatory Visit: Payer: Self-pay | Admitting: Obstetrics and Gynecology

## 2018-06-29 ENCOUNTER — Ambulatory Visit (HOSPITAL_COMMUNITY)
Admission: RE | Admit: 2018-06-29 | Discharge: 2018-06-29 | Disposition: A | Discharge status: Home / Self Care | Payer: 59 | Source: Ambulatory Visit | Attending: Obstetrics and Gynecology | Admitting: Obstetrics and Gynecology

## 2018-06-29 DIAGNOSIS — D509 Iron deficiency anemia, unspecified: Secondary | ICD-10-CM | POA: Insufficient documentation

## 2018-06-29 MED ORDER — SODIUM CHLORIDE 0.9 % IV SOLN
INTRAVENOUS | Status: DC | PRN
  Administered 2018-06-29: 250 mL via INTRAVENOUS

## 2018-06-29 MED ORDER — SODIUM CHLORIDE 0.9 % IV SOLN
510.0000 mg | Freq: Once | INTRAVENOUS | Status: AC
  Administered 2018-06-29: 510 mg via INTRAVENOUS
  Filled 2018-06-29: qty 17

## 2018-06-29 NOTE — Progress Notes (Signed)
PATIENT CARE CENTER NOTE  Diagnosis: Iron Deficiency Anemia    Provider: Dr. Sheronette Cousins   Procedure: IV Feraheme    Note: Patient received Feraheme infusion. Tolerated well with no adverse reaction. Observed patient for 30 minutes post-infusion. Vital signs stable. Discharge instructions given. Patient to come back next week for second Feraheme infusion.  Alert, oriented and ambulatory at discharge.   

## 2018-06-29 NOTE — Discharge Instructions (Signed)

## 2018-07-01 ENCOUNTER — Other Ambulatory Visit: Payer: Self-pay

## 2018-07-01 ENCOUNTER — Inpatient Hospital Stay (HOSPITAL_COMMUNITY)
Admission: AD | Admit: 2018-07-01 | Discharge: 2018-07-01 | Disposition: A | Discharge status: Home / Self Care | Payer: 59 | Attending: Obstetrics and Gynecology | Admitting: Obstetrics and Gynecology

## 2018-07-01 ENCOUNTER — Encounter (HOSPITAL_COMMUNITY): Payer: Self-pay | Admitting: *Deleted

## 2018-07-01 DIAGNOSIS — O10013 Pre-existing essential hypertension complicating pregnancy, third trimester: Secondary | ICD-10-CM | POA: Diagnosis not present

## 2018-07-01 DIAGNOSIS — Z3A31 31 weeks gestation of pregnancy: Secondary | ICD-10-CM | POA: Diagnosis not present

## 2018-07-01 DIAGNOSIS — O30003 Twin pregnancy, unspecified number of placenta and unspecified number of amniotic sacs, third trimester: Secondary | ICD-10-CM | POA: Diagnosis not present

## 2018-07-01 DIAGNOSIS — O26893 Other specified pregnancy related conditions, third trimester: Secondary | ICD-10-CM | POA: Diagnosis present

## 2018-07-01 DIAGNOSIS — O4703 False labor before 37 completed weeks of gestation, third trimester: Secondary | ICD-10-CM

## 2018-07-01 MED ORDER — CYCLOBENZAPRINE HCL 10 MG PO TABS: 10.0000 mg | ORAL_TABLET | Freq: Three times a day (TID) | ORAL | 0 refills | Status: AC | PRN

## 2018-07-01 MED ORDER — CYCLOBENZAPRINE HCL 10 MG PO TABS
10.0000 mg | ORAL_TABLET | Freq: Once | ORAL | Status: AC
  Administered 2018-07-01: 10 mg via ORAL
  Filled 2018-07-01: qty 1

## 2018-07-01 MED ORDER — LACTATED RINGERS IV BOLUS
1000.0000 mL | Freq: Once | INTRAVENOUS | Status: AC
  Administered 2018-07-01: 1000 mL via INTRAVENOUS

## 2018-07-01 NOTE — MAU Note (Signed)
Sent from office by MD secondary having ctxs.  SVE done cervix FT/70/-2.  Denies VB or LOF.  +FM x2.

## 2018-07-01 NOTE — MAU Note (Signed)
Urine in lab 

## 2018-07-01 NOTE — Discharge Instructions (Signed)
PTL precautions 

## 2018-07-01 NOTE — MAU Provider Note (Signed)
History     Chief Complaint  Patient presents with  . Contractions  38 yo [id number] MF with twin gestation @ 31 6/7 weeks  Sent from the office due to persistent ctxs. Pt presented for routine OB care and was having NST due to twins. Pt had 17 OHP weekly injection. Exam showed ft/70/-3. BMZ complete. NST reactive x 2. Ctx q 2 mins noted and reported as more painful per pt. Denies urinary sx  OB History    Gravida  4   Para  3   Term  2   Preterm  1   AB      Living  3     SAB      TAB      Ectopic      Multiple  0   Live Births  3           Past Medical History:  Diagnosis Date  . Anemia   . Candidiasis of mouth   . Galactorrhea not associated with childbirth   . Hypertension   . Vaginal Pap smear, abnormal     Past Surgical History:  Procedure Laterality Date  . DILATION AND CURETTAGE OF UTERUS  July 2013    Family History  Problem Relation Age of Onset  . Asthma Maternal Grandmother   . Hypertension Maternal Grandmother   . Asthma Maternal Aunt   . Hypertension Mother   . Hypertension Father   . Hypertension Maternal Grandfather     Social History   Tobacco Use  . Smoking status: Never Smoker  . Smokeless tobacco: Never Used  Substance Use Topics  . Alcohol use: No  . Drug use: No    Allergies:  Allergies  Allergen Reactions  . Labetalol Shortness Of Breath    Medications Prior to Admission  Medication Sig Dispense Refill Last Dose  . aspirin EC 81 MG tablet Take 81 mg by mouth daily.   06/16/2018 at Unknown time  . docusate sodium (COLACE) 100 MG capsule Take 100 mg by mouth daily.   06/16/2018 at Unknown time  . folic acid (FOLVITE) 1 MG tablet Take 1 tablet by mouth daily.   06/16/2018 at Unknown time  . hydroxyprogesterone caproate (MAKENA) 250 mg/mL OIL injection Inject 250 mg into the muscle once a week.   Past Week at Unknown time  . indomethacin (INDOCIN) 25 MG capsule Take 1 capsule (25 mg total) by mouth every 6 (six) hours.  (Patient not taking: Reported on 06/17/2018) 4 capsule 0 Not Taking at Unknown time  . metoprolol succinate (TOPROL-XL) 50 MG 24 hr tablet Take 50 mg by mouth daily. Take with or immediately following a meal.   06/17/2018 at Unknown time  . NIFEdipine (ADALAT CC) 60 MG 24 hr tablet Take 1 tablet (60 mg total) by mouth daily. (Patient taking differently: Take 30 mg by mouth daily. ) 30 tablet 8 06/03/2018 at Unknown time  . pantoprazole (PROTONIX) 20 MG tablet Take 1 tablet (20 mg total) by mouth 2 (two) times daily before a meal. 60 tablet 0 06/17/2018 at Unknown time  . Prenatal Vit-Fe Fumarate-FA (PRENATAL MULTIVITAMIN) TABS tablet Take 1 tablet by mouth daily at 12 noon.   06/17/2018 at Unknown time     Physical Exam   Blood pressure (!) 141/83, pulse 88, temperature 98 F (36.7 C), temperature source Oral, resp. rate 20, height 5\' 2"  (1.575 m), weight 80.5 kg, SpO2 100 %, unknown if currently breastfeeding.  No exam performed today, done  in office.  Tracing: A: baseline 140( +) accel Twin B: baseline 135 (+) accel irreg ctx ED Course  [facility] Twin gestation @ 3 1 6/7 weeks Chronic HTn on med P) flexeril. IVF bolus.. monitor MDM  Serita Kyle, MD 1:23 PM 07/01/2018  Addendum: responsive to IVF and flexeril / dc/ home Keep sched appt. PTL prec

## 2018-07-06 ENCOUNTER — Ambulatory Visit (HOSPITAL_COMMUNITY)
Admission: RE | Admit: 2018-07-06 | Discharge: 2018-07-06 | Disposition: A | Discharge status: Home / Self Care | Payer: 59 | Source: Ambulatory Visit | Attending: Obstetrics and Gynecology | Admitting: Obstetrics and Gynecology

## 2018-07-06 ENCOUNTER — Other Ambulatory Visit: Payer: Self-pay

## 2018-07-06 DIAGNOSIS — D509 Iron deficiency anemia, unspecified: Secondary | ICD-10-CM | POA: Diagnosis not present

## 2018-07-06 MED ORDER — SODIUM CHLORIDE 0.9 % IV SOLN
INTRAVENOUS | Status: DC | PRN
  Administered 2018-07-06: 250 mL via INTRAVENOUS

## 2018-07-06 MED ORDER — SODIUM CHLORIDE 0.9 % IV SOLN
510.0000 mg | Freq: Once | INTRAVENOUS | Status: AC
  Administered 2018-07-06: 510 mg via INTRAVENOUS
  Filled 2018-07-06: qty 17

## 2018-07-06 NOTE — Discharge Instructions (Signed)

## 2018-07-06 NOTE — Progress Notes (Signed)
PATIENT CARE CENTER NOTE  Diagnosis: Iron Deficiency Anemia    Provider: Dr. Sheronette Cousins   Procedure: IV Feraheme    Note: Patient received second Feraheme infusion. Tolerated well with no adverse reaction. Observed patient for 30 minutes post-infusion. Vital signs stable. Discharge instructions given. Alert, oriented and ambulatory at discharge.            

## 2018-07-09 ENCOUNTER — Other Ambulatory Visit: Payer: Self-pay | Admitting: Obstetrics and Gynecology

## 2018-07-10 ENCOUNTER — Inpatient Hospital Stay (HOSPITAL_COMMUNITY)
Admission: AD | Admit: 2018-07-10 | Discharge: 2018-07-10 | Disposition: A | Discharge status: Home / Self Care | Payer: 59 | Attending: Obstetrics and Gynecology | Admitting: Obstetrics and Gynecology

## 2018-07-10 ENCOUNTER — Other Ambulatory Visit: Payer: Self-pay

## 2018-07-10 DIAGNOSIS — O4703 False labor before 37 completed weeks of gestation, third trimester: Secondary | ICD-10-CM | POA: Insufficient documentation

## 2018-07-10 DIAGNOSIS — Z3A00 Weeks of gestation of pregnancy not specified: Secondary | ICD-10-CM | POA: Diagnosis not present

## 2018-07-10 MED ORDER — BETAMETHASONE SOD PHOS & ACET 6 (3-3) MG/ML IJ SUSP
12.0000 mg | Freq: Once | INTRAMUSCULAR | Status: AC
  Administered 2018-07-10: 12 mg via INTRAMUSCULAR
  Filled 2018-07-10: qty 2

## 2018-07-10 NOTE — MAU Note (Addendum)
Pt is here for a repeat steroid injection. Denies any pain/headache/visual disturbances/VB/LOF. Reports good fetal movement. Vital signs reviewed with MAU provider Sharen Counter, CNM.  Pt to review with Dr. Cherly Hensen as well.

## 2018-07-26 ENCOUNTER — Inpatient Hospital Stay (HOSPITAL_COMMUNITY)
Admission: AD | Admit: 2018-07-26 | Discharge: 2018-07-26 | Disposition: A | Discharge status: Home / Self Care | Payer: 59 | Source: Ambulatory Visit | Attending: Obstetrics and Gynecology | Admitting: Obstetrics and Gynecology

## 2018-07-26 ENCOUNTER — Other Ambulatory Visit: Payer: Self-pay

## 2018-07-26 ENCOUNTER — Encounter (HOSPITAL_COMMUNITY): Payer: Self-pay

## 2018-07-26 DIAGNOSIS — Z3A35 35 weeks gestation of pregnancy: Secondary | ICD-10-CM | POA: Insufficient documentation

## 2018-07-26 DIAGNOSIS — O99113 Other diseases of the blood and blood-forming organs and certain disorders involving the immune mechanism complicating pregnancy, third trimester: Secondary | ICD-10-CM | POA: Insufficient documentation

## 2018-07-26 DIAGNOSIS — O10913 Unspecified pre-existing hypertension complicating pregnancy, third trimester: Secondary | ICD-10-CM

## 2018-07-26 DIAGNOSIS — O30043 Twin pregnancy, dichorionic/diamniotic, third trimester: Secondary | ICD-10-CM | POA: Diagnosis not present

## 2018-07-26 DIAGNOSIS — D696 Thrombocytopenia, unspecified: Secondary | ICD-10-CM | POA: Insufficient documentation

## 2018-07-26 LAB — SAVE SMEAR(SSMR), FOR PROVIDER SLIDE REVIEW

## 2018-07-26 LAB — COMPREHENSIVE METABOLIC PANEL
ALT: 11 U/L (ref 0–44)
AST: 18 U/L (ref 15–41)
Albumin: 2.8 g/dL — ABNORMAL LOW (ref 3.5–5.0)
Alkaline Phosphatase: 210 U/L — ABNORMAL HIGH (ref 38–126)
Anion gap: 8 (ref 5–15)
BUN: 7 mg/dL (ref 6–20)
CO2: 20 mmol/L — ABNORMAL LOW (ref 22–32)
Calcium: 8.9 mg/dL (ref 8.9–10.3)
Chloride: 108 mmol/L (ref 98–111)
Creatinine, Ser: 0.99 mg/dL (ref 0.44–1.00)
GFR calc Af Amer: >60 mL/min (ref >60)
GFR calc non Af Amer: >60 mL/min (ref >60)
Glucose, Bld: 79 mg/dL (ref 70–99)
Potassium: 4.2 mmol/L (ref 3.5–5.1)
Sodium: 136 mmol/L (ref 135–145)
Total Bilirubin: 1.1 mg/dL (ref 0.3–1.2)
Total Protein: 6.2 g/dL — ABNORMAL LOW (ref 6.5–8.1)

## 2018-07-26 LAB — CBC
HCT: 34.9 % — ABNORMAL LOW (ref 36.0–46.0)
Hemoglobin: 11.2 g/dL — ABNORMAL LOW (ref 12.0–15.0)
MCH: 27.9 pg (ref 26.0–34.0)
MCHC: 32.1 g/dL (ref 30.0–36.0)
MCV: 86.8 fL (ref 80.0–100.0)
Platelets: UNDETERMINED 10*3/uL (ref 150–400)
RBC: 4.02 MIL/uL (ref 3.87–5.11)
RDW: 18.5 % — ABNORMAL HIGH (ref 11.5–15.5)
WBC: 3.7 10*3/uL — ABNORMAL LOW (ref 4.0–10.5)
nRBC: 0 % (ref 0.0–0.2)

## 2018-07-26 LAB — PROTEIN / CREATININE RATIO, URINE
Creatinine, Urine: 227.7 mg/dL
Protein Creatinine Ratio: 0.1 mg/mg{Cre} (ref 0.00–0.15)
Total Protein, Urine: 22 mg/dL

## 2018-07-26 LAB — PLATELET COUNT
Platelets: UNDETERMINED 10*3/uL (ref 150–400)
Platelets: UNDETERMINED 10*3/uL (ref 150–400)
Platelets: 101 10*3/uL — ABNORMAL LOW (ref 150–400)

## 2018-07-26 LAB — URIC ACID: Uric Acid, Serum: 7.7 mg/dL — ABNORMAL HIGH (ref 2.5–7.1)

## 2018-07-26 NOTE — Discharge Instructions (Signed)
Labor precaution, preeclampsia warning signs

## 2018-07-26 NOTE — Progress Notes (Signed)
I looked at 3 blood smears on Ms. Loretta Dixon.  They were all very consistent.  There was adequate platelets on all 3 smears.  There were very few platelet clumps that I saw.  The clumps typically had 4-5 platelets.\  The platelets were generally large and well granulated.  This indicates likely sequestration as the etiology of the mild thrombocytopenia.    Her platelets should be very functional as they are young platelets and are being made properly by the bone marrow.  I do not see any contraindication for doing any invasive procedure on Ms. Loretta Dixon.  Her platelets are adequate to maintain hemostasis,  I do not see any reason that she should not have an epidural for her delivery.  I talked to the lab techs about my findings and told them that they can report the platelet count as 104,000.  In reality. I think the platelet count is probably close to 120,000.  I spoke with Dr. Cherly Hensen tonight to give her my recommendations and that I do not see any reason to postpone the delivery if the patient is developing eclampsia.  I will continue to pray for the patient and her babies that all will be healthy!!!  Christin Bach, MD

## 2018-07-26 NOTE — Progress Notes (Signed)
Tracing sketchy for twin A, not tracing @ times

## 2018-07-26 NOTE — MAU Note (Signed)
Pt sent from office for HTN, no symptoms. Bloodwork order per Dr. Cherly Hensen

## 2018-07-26 NOTE — Progress Notes (Signed)
After 4 sticks for CBC which were reported as clumped., heme/onc was called to assist with plt ct result.  smear done and reviewed graciously by dr Myna Hidalgo  pt's remaining labs are not c/w superimposed preeclampsia  at this time, gestational thrombocytopenia is the diagnosis  Pt has known underlying chronic HTN on med and is on baby asa. She has not had toxemia with her other pregnancies although aware twin gestation puts her in special category D/c home  Keep sched ob appt

## 2018-07-26 NOTE — MAU Provider Note (Signed)
History     Chief Complaint  Patient presents with  . Hypertension   38 yo G 4P2103 BF with twin gestation( di-di) @ 35 3/[redacted] weeks gestation [facility]. Pt has known chronic HTN on procardia( which was recently increased) and topral. She notes leg swelling and had a 3 lb weight gain since visit at office on Friday. Denies h/a, visual changes or epigastric pain.  sono done at office had BPP 8/8 x 2, nl fluid.  Prior PIH labs done through the office on Friday were nl except elevated uric acid. Hx PTB on weekly Makena. Good FM no vaginal bleeding.   OB History    Gravida  4   Para  3   Term  2   Preterm  1   AB      Living  3     SAB      TAB      Ectopic      Multiple  0   Live Births  3           Past Medical History:  Diagnosis Date  . Anemia   . Candidiasis of mouth   . Galactorrhea not associated with childbirth   . Hypertension   . Vaginal Pap smear, abnormal     Past Surgical History:  Procedure Laterality Date  . DILATION AND CURETTAGE OF UTERUS  July 2013    Family History  Problem Relation Age of Onset  . Asthma Maternal Grandmother   . Hypertension Maternal Grandmother   . Asthma Maternal Aunt   . Hypertension Mother   . Hypertension Father   . Hypertension Maternal Grandfather     Social History   Tobacco Use  . Smoking status: Never Smoker  . Smokeless tobacco: Never Used  Substance Use Topics  . Alcohol use: No  . Drug use: No    Allergies:  Allergies  Allergen Reactions  . Labetalol Shortness Of Breath    Medications Prior to Admission  Medication Sig Dispense Refill Last Dose  . aspirin EC 81 MG tablet Take 81 mg by mouth daily.   06/16/2018 at Unknown time  . cyclobenzaprine (FLEXERIL) 10 MG tablet Take 1 tablet (10 mg total) by mouth 3 (three) times daily as needed for muscle spasms. 30 tablet 0   . docusate sodium (COLACE) 100 MG capsule Take 100 mg by mouth daily.    06/16/2018 at Unknown time  . folic acid (FOLVITE) 1 MG tablet Take 1 tablet by mouth daily.   06/16/2018 at Unknown time  . hydroxyprogesterone caproate (MAKENA) 250 mg/mL OIL injection Inject 250 mg into the muscle once a week.   Past Week at Unknown time  . metoprolol succinate (TOPROL-XL) 50 MG 24 hr tablet Take 50 mg by mouth daily. Take with or immediately following a meal.   06/17/2018 at Unknown time  . NIFEdipine (ADALAT CC) 60 MG 24 hr tablet Take 1 tablet (60 mg total) by mouth daily. (Patient taking differently: Take 30 mg by mouth daily. ) 30 tablet 8 06/03/2018 at Unknown time  . pantoprazole (PROTONIX) 20 MG tablet Take 1 tablet (20 mg total) by mouth 2 (two) times daily before a meal. 60 tablet 0 06/17/2018 at Unknown time  . Prenatal Vit-Fe Fumarate-FA (PRENATAL MULTIVITAMIN) TABS tablet Take 1 tablet by mouth daily at 12 noon.   06/17/2018 at Unknown time     Physical Exam   Blood pressure 140/90,  pulse 77, temperature 98.5 F (36.9 C), temperature source Oral, resp. rate 16, SpO2 100 %, unknown if currently breastfeeding.  No exam performed today, done in office. Tracing: baseline 140( twin A)  Twin B 130 (+) accels x 2 reactive x 2 Irregular  ctx  ED Course  IMP: chronic HTn on med Twin gestation @ 35 6/7 weeks Hx PTB on weekly Makena GERD on protonix P) PIH labs. Serial BP. NST MDM  addendum: PIH labs done CMP Latest Ref Rng & Units 07/26/2018 01/30/2018 03/28/2014  Glucose 70 - 99 mg/dL 79 84 74  BUN 6 - 20 mg/dL 7 11 8   Creatinine 0.44 - 1.00 mg/dL 7.12 1.97 5.88  Sodium 135 - 145 mmol/L 136 140 138  Potassium 3.5 - 5.1 mmol/L 4.2 3.9 3.9  Chloride 98 - 111 mmol/L 108 108 103  CO2 22 - 32 mmol/L 20(L) 21(L) 21  Calcium 8.9 - 10.3 mg/dL 8.9 9.5 9.1  Total Protein 6.5 - 8.1 g/dL 6.2(L) 8.0 6.9  Total Bilirubin 0.3 - 1.2 mg/dL 1.1 0.7 0.7  Alkaline Phos 38 - 126 U/L 210(H) 64 124(H)  AST 15 - 41 U/L 18 22 17   ALT 0 - 44 U/L 11 21 13    Uric acid Urine  protein/creatinine ratio: 0.10 CBC: plt clumped. hgb/hct 11.2/34.9   Repeat plt count ( redraw ordered)  Still clumped plt  third draw done after speaking with lab manager who suggested lab be redrawn for the third time and walked to the lab.  with the third draw still having clumping Heme/onc on call physician called regarding this issue. After discussion with the understanding that I would not have a pathologist to see the slide after hours, dr Myna Hidalgo agreed to look at a slide made( labs called to request this which then had to be ordered).  Disc implication of not having plt results( regional anesthesia issue etc). Doubt superimposed preeclampsia or HELLP syndrome with other labs normal. Await result  Serita Kyle, MD 2:22 PM 07/26/2018

## 2018-08-01 ENCOUNTER — Inpatient Hospital Stay (HOSPITAL_COMMUNITY)
Admission: AD | Admit: 2018-08-01 | Discharge: 2018-08-03 | DRG: 806 | Disposition: A | Discharge status: Home / Self Care | Payer: 59 | Attending: Obstetrics and Gynecology | Admitting: Obstetrics and Gynecology

## 2018-08-01 ENCOUNTER — Encounter (HOSPITAL_COMMUNITY): Payer: Self-pay | Admitting: *Deleted

## 2018-08-01 ENCOUNTER — Inpatient Hospital Stay (HOSPITAL_COMMUNITY): Payer: 59 | Admitting: Anesthesiology

## 2018-08-01 DIAGNOSIS — Z8759 Personal history of other complications of pregnancy, childbirth and the puerperium: Secondary | ICD-10-CM

## 2018-08-01 DIAGNOSIS — O30043 Twin pregnancy, dichorionic/diamniotic, third trimester: Secondary | ICD-10-CM | POA: Diagnosis present

## 2018-08-01 DIAGNOSIS — O1002 Pre-existing essential hypertension complicating childbirth: Secondary | ICD-10-CM | POA: Diagnosis present

## 2018-08-01 DIAGNOSIS — O10919 Unspecified pre-existing hypertension complicating pregnancy, unspecified trimester: Secondary | ICD-10-CM | POA: Diagnosis present

## 2018-08-01 DIAGNOSIS — K219 Gastro-esophageal reflux disease without esophagitis: Secondary | ICD-10-CM | POA: Diagnosis present

## 2018-08-01 DIAGNOSIS — O114 Pre-existing hypertension with pre-eclampsia, complicating childbirth: Principal | ICD-10-CM | POA: Diagnosis present

## 2018-08-01 DIAGNOSIS — Z3A36 36 weeks gestation of pregnancy: Secondary | ICD-10-CM

## 2018-08-01 DIAGNOSIS — O9962 Diseases of the digestive system complicating childbirth: Secondary | ICD-10-CM | POA: Diagnosis present

## 2018-08-01 DIAGNOSIS — D696 Thrombocytopenia, unspecified: Secondary | ICD-10-CM | POA: Diagnosis present

## 2018-08-01 DIAGNOSIS — O9912 Other diseases of the blood and blood-forming organs and certain disorders involving the immune mechanism complicating childbirth: Secondary | ICD-10-CM | POA: Diagnosis present

## 2018-08-01 DIAGNOSIS — O10913 Unspecified pre-existing hypertension complicating pregnancy, third trimester: Secondary | ICD-10-CM

## 2018-08-01 LAB — COMPREHENSIVE METABOLIC PANEL
ALT: 13 U/L (ref 0–44)
AST: 18 U/L (ref 15–41)
Albumin: 2.8 g/dL — ABNORMAL LOW (ref 3.5–5.0)
Alkaline Phosphatase: 212 U/L — ABNORMAL HIGH (ref 38–126)
Anion gap: 9 (ref 5–15)
BUN: 6 mg/dL (ref 6–20)
CO2: 22 mmol/L (ref 22–32)
Calcium: 8.6 mg/dL — ABNORMAL LOW (ref 8.9–10.3)
Chloride: 108 mmol/L (ref 98–111)
Creatinine, Ser: 0.94 mg/dL (ref 0.44–1.00)
GFR calc Af Amer: >60 mL/min (ref >60)
GFR calc non Af Amer: >60 mL/min (ref >60)
Glucose, Bld: 90 mg/dL (ref 70–99)
Potassium: 3.6 mmol/L (ref 3.5–5.1)
Sodium: 139 mmol/L (ref 135–145)
Total Bilirubin: 0.6 mg/dL (ref 0.3–1.2)
Total Protein: 6.1 g/dL — ABNORMAL LOW (ref 6.5–8.1)

## 2018-08-01 LAB — URINALYSIS, ROUTINE W REFLEX MICROSCOPIC
Bilirubin Urine: NEGATIVE
Glucose, UA: NEGATIVE mg/dL
Hgb urine dipstick: NEGATIVE
Ketones, ur: NEGATIVE mg/dL
Leukocytes,Ua: NEGATIVE
Nitrite: NEGATIVE
Protein, ur: 30 mg/dL — AB
Specific Gravity, Urine: 1.004 — ABNORMAL LOW (ref 1.005–1.030)
pH: 6 (ref 5.0–8.0)

## 2018-08-01 LAB — PROTEIN / CREATININE RATIO, URINE
Creatinine, Urine: 56.65 mg/dL
Protein Creatinine Ratio: 0.53 mg/mg{Cre} — ABNORMAL HIGH (ref 0.00–0.15)
Total Protein, Urine: 30 mg/dL

## 2018-08-01 LAB — CBC
HCT: 36.8 % (ref 36.0–46.0)
HCT: 37 % (ref 36.0–46.0)
Hemoglobin: 11.6 g/dL — ABNORMAL LOW (ref 12.0–15.0)
Hemoglobin: 11.8 g/dL — ABNORMAL LOW (ref 12.0–15.0)
MCH: 27 pg (ref 26.0–34.0)
MCH: 27.3 pg (ref 26.0–34.0)
MCHC: 31.5 g/dL (ref 30.0–36.0)
MCHC: 31.9 g/dL (ref 30.0–36.0)
MCV: 85.8 fL (ref 80.0–100.0)
MCV: 85.6 fL (ref 80.0–100.0)
Platelets: 129 10*3/uL — ABNORMAL LOW (ref 150–400)
Platelets: 133 10*3/uL — ABNORMAL LOW (ref 150–400)
RBC: 4.29 MIL/uL (ref 3.87–5.11)
RBC: 4.32 MIL/uL (ref 3.87–5.11)
RDW: 17.7 % — ABNORMAL HIGH (ref 11.5–15.5)
RDW: 17.9 % — ABNORMAL HIGH (ref 11.5–15.5)
WBC: 3.3 10*3/uL — ABNORMAL LOW (ref 4.0–10.5)
WBC: 5.5 10*3/uL (ref 4.0–10.5)
nRBC: 0 % (ref 0.0–0.2)
nRBC: 0 % (ref 0.0–0.2)

## 2018-08-01 LAB — MAGNESIUM: Magnesium: 4.4 mg/dL — ABNORMAL HIGH (ref 1.7–2.4)

## 2018-08-01 LAB — TYPE AND SCREEN
ABO/RH(D): A POS
Antibody Screen: NEGATIVE

## 2018-08-01 LAB — URIC ACID: Uric Acid, Serum: 7.9 mg/dL — ABNORMAL HIGH (ref 2.5–7.1)

## 2018-08-01 MED ORDER — LABETALOL HCL 5 MG/ML IV SOLN: 40.0000 mg | INTRAVENOUS | Status: DC | PRN

## 2018-08-01 MED ORDER — DIBUCAINE (PERIANAL) 1 % EX OINT: 1.0000 "application " | TOPICAL_OINTMENT | CUTANEOUS | Status: DC | PRN

## 2018-08-01 MED ORDER — PRENATAL MULTIVITAMIN CH
1.0000 | ORAL_TABLET | Freq: Every day | ORAL | Status: DC
  Administered 2018-08-02 – 2018-08-03 (×2): 1 via ORAL
  Filled 2018-08-01 (×2): qty 1

## 2018-08-01 MED ORDER — FENTANYL-BUPIVACAINE-NACL 0.5-0.125-0.9 MG/250ML-% EP SOLN
EPIDURAL | Status: AC
  Filled 2018-08-01: qty 250

## 2018-08-01 MED ORDER — LACTATED RINGERS IV SOLN: 500.0000 mL | Freq: Once | INTRAVENOUS | Status: DC

## 2018-08-01 MED ORDER — METOPROLOL SUCCINATE ER 50 MG PO TB24
50.0000 mg | ORAL_TABLET | Freq: Every day | ORAL | Status: DC
  Administered 2018-08-02 – 2018-08-03 (×2): 50 mg via ORAL
  Filled 2018-08-01 (×2): qty 1

## 2018-08-01 MED ORDER — HYDRALAZINE HCL 20 MG/ML IJ SOLN: 5.0000 mg | INTRAMUSCULAR | Status: DC | PRN

## 2018-08-01 MED ORDER — DIPHENHYDRAMINE HCL 50 MG/ML IJ SOLN: 12.5000 mg | INTRAMUSCULAR | Status: DC | PRN

## 2018-08-01 MED ORDER — BENZOCAINE-MENTHOL 20-0.5 % EX AERO
1.0000 "application " | INHALATION_SPRAY | CUTANEOUS | Status: DC | PRN
  Administered 2018-08-01: 1 via TOPICAL
  Filled 2018-08-01: qty 56

## 2018-08-01 MED ORDER — OXYTOCIN 40 UNITS IN NORMAL SALINE INFUSION - SIMPLE MED: 2.5000 [IU]/h | INTRAVENOUS | Status: DC

## 2018-08-01 MED ORDER — ACETAMINOPHEN 325 MG PO TABS: 650.0000 mg | ORAL_TABLET | ORAL | Status: DC | PRN

## 2018-08-01 MED ORDER — NIFEDIPINE ER OSMOTIC RELEASE 30 MG PO TB24: 60.0000 mg | ORAL_TABLET | Freq: Every day | ORAL | Status: DC

## 2018-08-01 MED ORDER — LIDOCAINE HCL (PF) 1 % IJ SOLN
30.0000 mL | INTRAMUSCULAR | Status: DC | PRN
  Filled 2018-08-01: qty 30

## 2018-08-01 MED ORDER — ONDANSETRON HCL 4 MG/2ML IJ SOLN: 4.0000 mg | Freq: Four times a day (QID) | INTRAMUSCULAR | Status: DC | PRN

## 2018-08-01 MED ORDER — OXYTOCIN BOLUS FROM INFUSION
500.0000 mL | Freq: Once | INTRAVENOUS | Status: AC
  Administered 2018-08-01: 16:00:00 500 mL via INTRAVENOUS

## 2018-08-01 MED ORDER — DIPHENHYDRAMINE HCL 25 MG PO CAPS: 25.0000 mg | ORAL_CAPSULE | Freq: Four times a day (QID) | ORAL | Status: DC | PRN

## 2018-08-01 MED ORDER — ZOLPIDEM TARTRATE 5 MG PO TABS: 5.0000 mg | ORAL_TABLET | Freq: Every evening | ORAL | Status: DC | PRN

## 2018-08-01 MED ORDER — ONDANSETRON HCL 4 MG/2ML IJ SOLN: 4.0000 mg | INTRAMUSCULAR | Status: DC | PRN

## 2018-08-01 MED ORDER — FENTANYL-BUPIVACAINE-NACL 0.5-0.125-0.9 MG/250ML-% EP SOLN: 12.0000 mL/h | EPIDURAL | Status: DC | PRN

## 2018-08-01 MED ORDER — SIMETHICONE 80 MG PO CHEW: 80.0000 mg | CHEWABLE_TABLET | ORAL | Status: DC | PRN

## 2018-08-01 MED ORDER — OXYCODONE-ACETAMINOPHEN 5-325 MG PO TABS: 1.0000 | ORAL_TABLET | ORAL | Status: DC | PRN

## 2018-08-01 MED ORDER — LABETALOL HCL 5 MG/ML IV SOLN: 20.0000 mg | INTRAVENOUS | Status: DC | PRN

## 2018-08-01 MED ORDER — ONDANSETRON HCL 4 MG PO TABS: 4.0000 mg | ORAL_TABLET | ORAL | Status: DC | PRN

## 2018-08-01 MED ORDER — PANTOPRAZOLE SODIUM 40 MG PO TBEC
40.0000 mg | DELAYED_RELEASE_TABLET | Freq: Every day | ORAL | Status: DC
  Administered 2018-08-01 – 2018-08-03 (×3): 40 mg via ORAL
  Filled 2018-08-01 (×2): qty 1

## 2018-08-01 MED ORDER — OXYTOCIN 40 UNITS IN NORMAL SALINE INFUSION - SIMPLE MED
1.0000 m[IU]/min | INTRAVENOUS | Status: DC
  Administered 2018-08-01: 11:00:00 2 m[IU]/min via INTRAVENOUS
  Filled 2018-08-01: qty 1000

## 2018-08-01 MED ORDER — PHENYLEPHRINE 40 MCG/ML (10ML) SYRINGE FOR IV PUSH (FOR BLOOD PRESSURE SUPPORT)
80.0000 ug | PREFILLED_SYRINGE | INTRAVENOUS | Status: DC | PRN
  Filled 2018-08-01: qty 10

## 2018-08-01 MED ORDER — EPHEDRINE 5 MG/ML INJ
10.0000 mg | INTRAVENOUS | Status: DC | PRN
  Filled 2018-08-01: qty 2

## 2018-08-01 MED ORDER — COCONUT OIL OIL
1.0000 "application " | TOPICAL_OIL | Status: DC | PRN
  Administered 2018-08-01: 1 via TOPICAL

## 2018-08-01 MED ORDER — LACTATED RINGERS IV SOLN: 500.0000 mL | INTRAVENOUS | Status: DC | PRN

## 2018-08-01 MED ORDER — WITCH HAZEL-GLYCERIN EX PADS
1.0000 "application " | MEDICATED_PAD | CUTANEOUS | Status: DC | PRN
  Administered 2018-08-01: 1 via TOPICAL

## 2018-08-01 MED ORDER — SOD CITRATE-CITRIC ACID 500-334 MG/5ML PO SOLN
30.0000 mL | ORAL | Status: DC | PRN
  Filled 2018-08-01: qty 15

## 2018-08-01 MED ORDER — IBUPROFEN 600 MG PO TABS
600.0000 mg | ORAL_TABLET | Freq: Four times a day (QID) | ORAL | Status: DC
  Administered 2018-08-01 – 2018-08-03 (×7): 600 mg via ORAL
  Filled 2018-08-01 (×8): qty 1

## 2018-08-01 MED ORDER — SODIUM CHLORIDE (PF) 0.9 % IJ SOLN
INTRAMUSCULAR | Status: DC | PRN
  Administered 2018-08-01: 12 mL/h via EPIDURAL

## 2018-08-01 MED ORDER — OXYTOCIN 10 UNIT/ML IJ SOLN: 10.0000 [IU] | Freq: Once | INTRAMUSCULAR | Status: DC

## 2018-08-01 MED ORDER — OXYCODONE-ACETAMINOPHEN 5-325 MG PO TABS: 2.0000 | ORAL_TABLET | ORAL | Status: DC | PRN

## 2018-08-01 MED ORDER — HYDRALAZINE HCL 20 MG/ML IJ SOLN: 10.0000 mg | INTRAMUSCULAR | Status: DC | PRN

## 2018-08-01 MED ORDER — FERROUS SULFATE 325 (65 FE) MG PO TABS
325.0000 mg | ORAL_TABLET | Freq: Two times a day (BID) | ORAL | Status: DC
  Administered 2018-08-02 – 2018-08-03 (×2): 325 mg via ORAL
  Filled 2018-08-01 (×2): qty 1

## 2018-08-01 MED ORDER — MAGNESIUM SULFATE 40 G IN LACTATED RINGERS - SIMPLE
2.0000 g/h | INTRAVENOUS | Status: AC
  Administered 2018-08-02: 03:00:00 2 g/h via INTRAVENOUS
  Filled 2018-08-01 (×2): qty 500

## 2018-08-01 MED ORDER — TERBUTALINE SULFATE 1 MG/ML IJ SOLN: 0.2500 mg | Freq: Once | INTRAMUSCULAR | Status: DC | PRN

## 2018-08-01 MED ORDER — LACTATED RINGERS IV SOLN
INTRAVENOUS | Status: DC
  Administered 2018-08-01 – 2018-08-02 (×4): via INTRAVENOUS

## 2018-08-01 MED ORDER — LIDOCAINE HCL (PF) 1 % IJ SOLN
INTRAMUSCULAR | Status: DC | PRN
  Administered 2018-08-01 (×2): 4 mL via EPIDURAL

## 2018-08-01 MED ORDER — MAGNESIUM SULFATE BOLUS VIA INFUSION
4.0000 g | Freq: Once | INTRAVENOUS | Status: AC
  Administered 2018-08-01: 11:00:00 4 g via INTRAVENOUS
  Filled 2018-08-01: qty 500

## 2018-08-01 MED ORDER — SENNOSIDES-DOCUSATE SODIUM 8.6-50 MG PO TABS
2.0000 | ORAL_TABLET | ORAL | Status: DC
  Administered 2018-08-01: 2 via ORAL
  Filled 2018-08-01 (×2): qty 2

## 2018-08-01 MED ORDER — FOLIC ACID 1 MG PO TABS
1.0000 mg | ORAL_TABLET | Freq: Every day | ORAL | Status: DC
  Administered 2018-08-01 – 2018-08-03 (×3): 1 mg via ORAL
  Filled 2018-08-01 (×3): qty 1

## 2018-08-01 MED ORDER — NIFEDIPINE ER OSMOTIC RELEASE 30 MG PO TB24
60.0000 mg | ORAL_TABLET | Freq: Once | ORAL | Status: AC
  Administered 2018-08-01: 10:00:00 60 mg via ORAL
  Filled 2018-08-01: qty 2

## 2018-08-01 NOTE — Progress Notes (Signed)
S: pt c/o pain on right lower quad(has a window of pain since epidural) Notes vaginal pressure  O: BP 138/86 Pitocin 2 miu VE fully +2 station  Tracing: A baseline 115 Twin B baseline 120  ctx q 2 mins  IMP: complete  twins( v/v) Superimposed preeclampsia on magnesium sulfate Chronic HTN on Toprol/procardia P) anticipate vaginal delivery. Explained plan of care for 2nd twin/delivery in general to pt. Sonogram in room. NICU called

## 2018-08-01 NOTE — Anesthesia Procedure Notes (Signed)
Epidural Patient location during procedure: OB Start time: 08/01/2018 12:36 PM End time: 08/01/2018 1:52 PM  Staffing Anesthesiologist: Heather Roberts, MD Performed: anesthesiologist   Preanesthetic Checklist Completed: patient identified, site marked, pre-op evaluation, timeout performed, IV checked, risks and benefits discussed and monitors and equipment checked  Epidural Patient position: sitting Prep: DuraPrep Patient monitoring: heart rate, cardiac monitor, continuous pulse ox and blood pressure Approach: midline Location: L2-L3 Injection technique: LOR saline  Needle:  Needle type: Tuohy  Needle gauge: 17 G Needle length: 9 cm Needle insertion depth: 6 cm Catheter size: 20 Guage Catheter at skin depth: 10 cm Test dose: negative and Other  Assessment Events: blood not aspirated, injection not painful, no injection resistance and negative IV test  Additional Notes Informed consent obtained prior to proceeding including risk of failure, 1% risk of PDPH, risk of minor discomfort and bruising.  Discussed rare but serious complications including epidural abscess, permanent nerve injury, epidural hematoma.  Discussed alternatives to epidural analgesia and patient desires to proceed.  Timeout performed pre-procedure verifying patient name, procedure, and platelet count.  Patient tolerated procedure well.

## 2018-08-01 NOTE — Lactation Note (Addendum)
This note was copied from a baby's chart. Lactation Consultation Note  Patient Name: Loretta Dixon [id number] Date: 08/01/2018  Both baby boys STS with mom.Twin gestation both boys now 26 hours old,  Twin b vacuum assist vag delivery, mom with hypertension.  IVF pregnancy Both twins less than 6 pounds. .Parents report they last breastfed at 85 pm. Neither infant cuing. Parents report they feel they breastfed well.  Discussed formula supplementation with mom due to gestational age and blood sugar levels. Reviewed LPTI guidelines and left guidelines with parents. Mom reports she really does not want to do formula because she formula fed her first baby and she had a severe milk allergy.  Discussed pumping with DEBP and hand expression.  Mom is an experienced breastfeeder.  Breastfed her second exclusively for 18 months and her last one for 15 months. Mom able to hand express easily.  Assisted mom in hand expression and obtaining 12 ml of colostrum.  LC fed twin A back 6 ml of colostrum via curved tip syringe.  Dad fed twin B 6 ml of colostrum via curved tip syringe.  Mom reports she needs to go to restroom.  Discussed pumping and hand expressing past bf and feeding back at least 5-7 ml of breastmilk past breastfeeding. Took DEBP,kit, accesorie to mom to start pummimg.  Mom reports she has a Spectra DEBP for home use.  Mom wants to wait until past the next feeding or maybe even tomorrow to pump.  Let her now that was okay as long as she's doing lots of massage and hand expression past bf. Urged mom to call lactation as needed.                  Interventions    Lactation Tools Discussed/Used     Consult Status      Rollen Sox 08/01/2018, 9:31 PM

## 2018-08-01 NOTE — H&P (Addendum)
Loretta Dixon is a 38 y.o. female  [phone] MBF IVF twin pregnancy @ 36 2/7 weeks presenting for antepartum testing due to chronic HTN. Elevated BP noted despite changes in HTN med. Pt has known chronic HTN on Toprol and procardia. Pt denies h/a, visual changes or epigastric pain. Pt is on Protonix for GERD. (+) leg swelling. Last PIH labs done in office 4/9 was nl except plt ct 101K. Pt took her Toprol this am.  BMZ complete 2/20, 21 & at 32 weeks. Last growth 4lb 7 /4lb 6 oz  OB History    Gravida  4   Para  3   Term  2   Preterm  1   AB      Living  3     SAB      TAB      Ectopic      Multiple  0   Live Births  3          Past Medical History:  Diagnosis Date  . Anemia   . Candidiasis of mouth   . Galactorrhea not associated with childbirth   . Hypertension   . Vaginal Pap smear, abnormal    Past Surgical History:  Procedure Laterality Date  . DILATION AND CURETTAGE OF UTERUS  July 2013   Family History: family history includes Asthma in her maternal aunt and maternal grandmother; Hypertension in her father, maternal grandfather, maternal grandmother, and mother. Social History:  reports that she has never smoked. She has never used smokeless tobacco. She reports that she does not drink alcohol or use drugs.     Maternal Diabetes: No Genetic Screening: Declined after low level for panorama Maternal Ultrasounds/Referrals: Normal Fetal Ultrasounds or other Referrals:  None Maternal Substance Abuse:  No Significant Maternal Medications:  Meds include: Progesterone Protonix Other: baby asa, Toprol, procardia Significant Maternal Lab Results:  Lab values include: Group B Strep negative Other Comments:  IVF twin gestation, hx PTB treated with Makena, GERD, chronic HTN. BMZ complete  Review of Systems  Eyes: Negative for blurred vision.  Cardiovascular: Positive for leg swelling.  Gastrointestinal: Negative for heartburn, nausea and vomiting.  Neurological:  Negative for headaches.   Maternal Medical History:  Reason for admission: Nausea.  Fetal activity: Perceived fetal activity is normal.    Prenatal complications: PIH and thrombocytopenia.   Prenatal Complications - Diabetes: none.      Blood pressure (!) 162/92, pulse 68, temperature 98.4 F (36.9 C), temperature source Oral, resp. rate 19, SpO2 100 %, unknown if currently breastfeeding.  Patient Vitals for the past 24 hrs:  BP Temp Temp src Pulse Resp SpO2  08/01/18 1026 (!) 155/103 - - 77 - -  08/01/18 0916 (!) 162/92 - - 68 - -  08/01/18 0901 (!) 157/99 98.4 F (36.9 C) Oral 80 19 -  08/01/18 0850 (!) 144/104 - - 87 - 100 %  08/01/18 0836 (!) 140/102 - - 86 - -   Exam Physical Exam  Constitutional: She is oriented to person, place, and time. She appears well-developed and well-nourished.  HENT:  Head: Atraumatic.  Eyes: EOM are normal.  Neck: Neck supple.  Cardiovascular: Regular rhythm.  Respiratory: Breath sounds normal.  GI: Soft.  Musculoskeletal:        General: Edema present.  Neurological: She is alert and oriented to person, place, and time.  Skin: Skin is warm and dry.  Psychiatric: She has a normal mood and affect.   VE 1/70/-2  Prenatal labs:  ABO, Rh: --/--/A POS, A POS Performed at [facility], [address], [place], [place] [address]  [phone]) Antibody: NEG (02/26 2313) Rubella:  Immune RPR: Non Reactive (02/26 2313)  HBsAg:   neg HIV:   neg GBS:  negative  Tracing: baseline A: 130 Twin B: baseline 125 (+) accel to 150 irreg ctx Assessment/Plan: Worsening chronic HTN r/o superimposed preeclampsia Twin gestation @ 36 2/7 weeks GERD on protonix  P) admit. PIH labs. Pitocin induction. Early epidural. Intracervical balloon. Magnesium sulfate depending on labs. Cont BP med. Anti hypertensive protocol   Katheline Brendlinger A Soliyana Mcchristian 08/01/2018, 9:31 AM   Addendum: intracervical balloon placed.

## 2018-08-01 NOTE — Progress Notes (Signed)
S: notes painful ctx  O:BP (!) 151/97   Pulse 76   Temp 98.4 F (36.9 C) (Oral)   Resp 18   Ht 5\' 2"  (1.575 m)   Wt 81.2 kg   SpO2 100%   BMI 32.74 kg/m  CBC    Component Value Date/Time   WBC 3.3 (L) 08/01/2018 0859   RBC 4.29 08/01/2018 0859   HGB 11.6 (L) 08/01/2018 0859   HCT 36.8 08/01/2018 0859   PLT 129 (L) 08/01/2018 0859   MCV 85.8 08/01/2018 0859   MCH 27.0 08/01/2018 0859   MCHC 31.5 08/01/2018 0859   RDW 17.7 (H) 08/01/2018 0859   CMP Latest Ref Rng & Units 08/01/2018 07/26/2018 01/30/2018  Glucose 70 - 99 mg/dL 90 79 84  BUN 6 - 20 mg/dL 6 7 11   Creatinine 0.44 - 1.00 mg/dL 2.99 3.71 6.96  Sodium 135 - 145 mmol/L 139 136 140  Potassium 3.5 - 5.1 mmol/L 3.6 4.2 3.9  Chloride 98 - 111 mmol/L 108 108 108  CO2 22 - 32 mmol/L 22 20(L) 21(L)  Calcium 8.9 - 10.3 mg/dL 7.8(L) 8.9 9.5  Total Protein 6.5 - 8.1 g/dL 6.1(L) 6.2(L) 8.0  Total Bilirubin 0.3 - 1.2 mg/dL 0.6 1.1 0.7  Alkaline Phos 38 - 126 U/L 212(H) 210(H) 64  AST 15 - 41 U/L 18 18 22   ALT 0 - 44 U/L 13 11 21   protein/creatinine ratio; 0.53  IMP: Superimposed preeclampsia Twins @ 36 2/7 weeks Chronic HTn on topral, procardia P) proceed with epidural. Mg level 4 hrs after loading dose. Cont with pitocin. Strict I&O

## 2018-08-01 NOTE — Anesthesia Preprocedure Evaluation (Addendum)
Anesthesia Evaluation  Patient identified by MRN, date of birth, ID band Patient awake    Reviewed: Allergy & Precautions, NPO status , Patient's Chart, lab work & pertinent test results  History of Anesthesia Complications Negative for: history of anesthetic complications  Airway Mallampati: II  TM Distance: >3 FB Neck ROM: Full    Dental no notable dental hx. (+) Dental Advisory Given   Pulmonary neg pulmonary ROS,    Pulmonary exam normal        Cardiovascular hypertension, negative cardio ROS Normal cardiovascular exam     Neuro/Psych negative neurological ROS  negative psych ROS   GI/Hepatic negative GI ROS, Neg liver ROS,   Endo/Other  negative endocrine ROS  Renal/GU negative Renal ROS  negative genitourinary   Musculoskeletal negative musculoskeletal ROS (+)   Abdominal   Peds negative pediatric ROS (+)  Hematology negative hematology ROS (+)   Anesthesia Other Findings   Reproductive/Obstetrics (+) Pregnancy Twins, Preclampsia                            Anesthesia Physical Anesthesia Plan  ASA: III  Anesthesia Plan: Epidural   Post-op Pain Management:    Induction:   PONV Risk Score and Plan:   Airway Management Planned:   Additional Equipment:   Intra-op Plan:   Post-operative Plan:   Informed Consent: I have reviewed the patients History and Physical, chart, labs and discussed the procedure including the risks, benefits and alternatives for the proposed anesthesia with the patient or authorized representative who has indicated his/her understanding and acceptance.     Dental advisory given  Plan Discussed with: Anesthesiologist  Anesthesia Plan Comments:         Anesthesia Quick Evaluation

## 2018-08-01 NOTE — Progress Notes (Signed)
S: epidural complete  O: BP (!) 149/96   Pulse 66   Temp 98.4 F (36.9 C) (Oral)   Resp 18   Ht 5\' 2"  (1.575 m)   Wt 81.2 kg   SpO2 100%   BMI 32.74 kg/m   Pitocin  Magnesium sulfate VE intracervical balloon removed 5/60-70( patulous)/-1/0 deviated to left AROM clear fluid ISE placed  Tracing; A. Baseline 125 B: baseline 120 (+) accel to 130-135 Ctx q 2-  IMP: superimposed preeclampsia on magnesium sulfate Twins @ 36 2/7 wks Chronic HTN P)  Cont pitocin. Left exaggerated sims

## 2018-08-01 NOTE — Progress Notes (Signed)
MgSO4 4 gm bolus over 20 min started at 1123.

## 2018-08-01 NOTE — MAU Note (Signed)
Loretta Dixon is a 38 y.o. at [redacted]w[redacted]d here in MAU reporting: for a NST. Pain score: denies  FHT: + movement x2 Lab orders placed from triage: none

## 2018-08-02 ENCOUNTER — Other Ambulatory Visit: Payer: Self-pay

## 2018-08-02 DIAGNOSIS — Z8759 Personal history of other complications of pregnancy, childbirth and the puerperium: Secondary | ICD-10-CM

## 2018-08-02 LAB — RPR: RPR Ser Ql: NONREACTIVE

## 2018-08-02 LAB — CBC
HCT: 34 % — ABNORMAL LOW (ref 36.0–46.0)
Hemoglobin: 10.8 g/dL — ABNORMAL LOW (ref 12.0–15.0)
MCH: 26.9 pg (ref 26.0–34.0)
MCHC: 31.8 g/dL (ref 30.0–36.0)
MCV: 84.8 fL (ref 80.0–100.0)
Platelets: 141 10*3/uL — ABNORMAL LOW (ref 150–400)
RBC: 4.01 MIL/uL (ref 3.87–5.11)
RDW: 17.9 % — ABNORMAL HIGH (ref 11.5–15.5)
WBC: 5.1 10*3/uL (ref 4.0–10.5)
nRBC: 0 % (ref 0.0–0.2)

## 2018-08-02 LAB — COMPREHENSIVE METABOLIC PANEL
ALT: 14 U/L (ref 0–44)
AST: 23 U/L (ref 15–41)
Albumin: 2.6 g/dL — ABNORMAL LOW (ref 3.5–5.0)
Alkaline Phosphatase: 211 U/L — ABNORMAL HIGH (ref 38–126)
Anion gap: 8 (ref 5–15)
BUN: <5 mg/dL — ABNORMAL LOW (ref 6–20)
CO2: 23 mmol/L (ref 22–32)
Calcium: 7.5 mg/dL — ABNORMAL LOW (ref 8.9–10.3)
Chloride: 108 mmol/L (ref 98–111)
Creatinine, Ser: 0.85 mg/dL (ref 0.44–1.00)
GFR calc Af Amer: >60 mL/min (ref >60)
GFR calc non Af Amer: >60 mL/min (ref >60)
Glucose, Bld: 100 mg/dL — ABNORMAL HIGH (ref 70–99)
Potassium: 3.9 mmol/L (ref 3.5–5.1)
Sodium: 139 mmol/L (ref 135–145)
Total Bilirubin: 1 mg/dL (ref 0.3–1.2)
Total Protein: 5.6 g/dL — ABNORMAL LOW (ref 6.5–8.1)

## 2018-08-02 MED ORDER — LACTATED RINGERS IV SOLN: INTRAVENOUS | Status: DC

## 2018-08-02 MED ORDER — NIFEDIPINE ER OSMOTIC RELEASE 30 MG PO TB24
60.0000 mg | ORAL_TABLET | Freq: Every day | ORAL | Status: DC
  Administered 2018-08-02: 22:00:00 60 mg via ORAL
  Filled 2018-08-02: qty 2

## 2018-08-02 NOTE — Lactation Note (Signed)
This note was copied from a baby's chart. Lactation Consultation Note  Patient Name: Loretta Dixon [id number] Date: 08/02/2018 Reason for consult: Initial assessment;Late-preterm 34-36.6wks;Infant weight loss;Infant < 6lbs  45 hours old LPI twins who are now being partially BF and formula fed by his mother, she's a P4 and experienced BF. Mom had a DEBP kit in her room already, offered to set up a DEBP but mom politely declined, she told LC that she was too tired to pump this morning but that she's familiar with Medela products and that she'll set it up herself later. Asked mom to call for assistance if needed; this is her first set of twins.  Baby A  Baby "A" started getting supplemented today, also with Nutramigen Lipil due to Hx of allergies in the family. Mom is still nursing them for 10-15 minutes at a time. Mom had both babies sleeping and swaddled in the same bassinet when entering the room, educated mom about safe sleep practices and placed each baby in their own bassinet. LC brought both bassinets to mom's side of the bed so she can see both of her babies.  Baby B  He continues taking the breast and the Nutramigen Lipil after feedings; around every 2-3 hours. Mom now understands how important is supplement babies after every feeding since this will be the main source of their nutrition until her milk comes in full force and be replaced by the formula amounts given. Mom also understands that these babies won't be able to fully empty the breast for a few more weeks and she'll have to pump after feedings in order to completely empty to breast to avoid hurting her milk supply. She voiced understanding and is agreeable with the plan.  Feeding plan:  1. Encouraged mom to feed babies STS every 3 hours or sooner if feeding cues are present 2. If babies are not cueing in a 3 hours period, she'll wake them up to feed 3. Mom will start pumping today with a DEBP and will feed babies any amount  of breastmilk she may get 4. Supplementation with Nutramigen Lipil will follow after feedings according the LPI guidelines  Mom reported all questions and concerns were answered, she's aware of Gantt services and will call PRN.  Maternal Data    Feeding Feeding Type: Breast Fed   Interventions Interventions: Breast feeding basics reviewed  Lactation Tools Discussed/Used     Consult Status Consult Status: Follow-up Date: 08/03/18 Follow-up type: In-patient    Loretta Dixon Loretta Dixon 08/02/2018, 12:24 PM

## 2018-08-02 NOTE — Anesthesia Postprocedure Evaluation (Signed)
Anesthesia Post Note  Patient: Loretta Dixon  Procedure(s) Performed: AN AD HOC LABOR EPIDURAL     Patient location during evaluation: Mother Baby Anesthesia Type: Epidural Level of consciousness: awake and alert Pain management: pain level controlled Vital Signs Assessment: post-procedure vital signs reviewed and stable Respiratory status: spontaneous breathing, nonlabored ventilation and respiratory function stable Cardiovascular status: stable Postop Assessment: no headache, no backache and epidural receding Anesthetic complications: no Comments: Chart review    Last Vitals:  Vitals:   08/02/18 1210 08/02/18 1529  BP: (!) 148/92 (!) 141/92  Pulse: 89 91  Resp: 16 18  Temp: 36.6 C 36.8 C  SpO2: 99% 100%    Last Pain:  Vitals:   08/02/18 1529  TempSrc: Oral  PainSc:    Chart review only              Talasia Saulter DANIEL

## 2018-08-02 NOTE — Progress Notes (Signed)
PPD # 1 S/P VAVD    Loretta Dixon, Loretta Dixon [[id number]]  Live born female  Birth Weight: 4 lb 15.9 oz (2265 g) APGAR: 8, 9  Newborn Delivery   Birth date/time:  [birth year]/2018 16:09:00 Delivery type:  Vaginal, Spontaneous      Loretta Dixon, Loretta Dixon [[id number]]  Live born female  Birth Weight: 4 lb 12.4 oz (2165 g) APGAR: 9, 9  Newborn Delivery   Birth date/time:  08/01/2019 16:18:00 Delivery type:  Vaginal, Vacuum (Extractor)    Babies names: no names yet Loretta provider:    Rosette, Loretta Dixon [[id number]]  Loretta Dixon, Loretta Dixon, Loretta Dixon [[id number]]  Loretta Dixon, Loretta Dixon  Episiotomy:   Loretta Dixon, Loretta Dixon [[id number]]  None   Loretta Dixon, Loretta Dixon [[id number]]  None   Lacerations:   Loretta Dixon, Loretta Dixon [[id number]]  None   Loretta Dixon, Loretta Dixon [[id number]]  None   circumcision planned  Feeding: breast and bottle  Pain control at delivery:    Roxy, Mastandrea [[id number]]  Epidural   Lodema, Parma Angell [[id number]]  Epidural   S:  Reports feeling well, denies PEC s/sx             Tolerating po/ No nausea or vomiting             Bleeding is light             Pain controlled with ibuprofen (OTC)             Up ad lib / ambulatory / voiding without difficulties   O:  A & O x 3, in no apparent distress              VS:  Vitals:   08/02/18 0126 08/02/18 0200 08/02/18 0318 08/02/18 0820  BP:   (!) 156/95 (!) 139/93  Pulse:   91 90  Resp: '20 15 18 18  ' Temp:   98.2 F (36.8 C) 97.8 F (36.6 C)  TempSrc:   Oral Oral  SpO2:   100% 100%  Weight:      Height:        LABS:  Recent Labs    08/01/18 1741 08/02/18 0334  WBC 5.5 5.1  HGB 11.8* 10.8*  HCT 37.0 34.0*  PLT 133* 141*   Hepatic Function Latest Ref Rng & Units 08/02/2018 08/01/2018 07/26/2018  Total Protein 6.5 - 8.1 g/dL 5.6(L) 6.1(L) 6.2(L)  Albumin 3.5 - 5.0 g/dL 2.6(L) 2.8(L) 2.8(L)  AST 15 - 41 U/L '23 18 18  ' ALT 0 - 44 U/L '14 13 11  ' Alk Phosphatase 38  - 126 U/L 211(H) 212(H) 210(H)  Total Bilirubin 0.3 - 1.2 mg/dL 1.0 0.6 1.1    Blood type: --/--/A POS (04/12 6967)  Rubella:   immune  I&O: I/O last 3 completed shifts: In: 4990.3 [P.O.:2680; I.V.:2310.3] Out: 9725 [Urine:9575; Blood:150]   Vaccines: TDaP UTD         Flu    UTD   Lungs: Clear and unlabored  Heart: regular rate and rhythm / no murmurs  Abdomen: soft, non-tender, non-distended             Fundus: firm, non-tender, U-1  Perineum: intact, no edema  Lochia: small  Extremities: trace pedal edema, no calf pain or tenderness    A/P: PPD # 1 38 y.o., [id number]   Principal Problem:   Postpartum care following vaginal delivery TWINS 4/12 Active Problems:   Chronic hypertension complicating or reason for care during pregnancy with superimposed PEC  - Mag  Sulfate seizure prophylaxis x 24 hours  - BP in mild range, no neural sx, good diuresis, normal labs  - continue    Status post vacuum-assisted vaginal delivery   Doing well - stable status  Routine post partum orders  Lactation support for breastfeeding late term twins    Juliene Pina, MSN, CNM 08/02/2018, 9:00 AM

## 2018-08-03 MED ORDER — IBUPROFEN 800 MG PO TABS: 800.0000 mg | ORAL_TABLET | Freq: Three times a day (TID) | ORAL | 5 refills | Status: AC | PRN

## 2018-08-03 NOTE — Discharge Summary (Signed)
Obstetric Discharge Summary Reason for Admission: worsening chronic hypertension, twin gestation Prenatal Procedures: NST and ultrasound, BMZ, Makena Intrapartum Procedures: spontaneous vaginal delivery and vacuum, magnesium sulfate. epidural Postpartum Procedures: magnesium Complications-Operative and Postpartum: none Hemoglobin  Date Value Ref Range Status  08/02/2018 10.8 (L) 12.0 - 15.0 g/dL Final   HCT  Date Value Ref Range Status  08/02/2018 34.0 (L) 36.0 - 46.0 % Final    Physical Exam:  General: alert, cooperative and no distress Lochia: appropriate Uterine Fundus: firm Incision: n/a DVT Evaluation: Negative Homan's sign. Calf/Ankle edema is present. Hospital course: pt was seen in [facility] for antepartum testing due to chronic HTN in pregnancy. Twin gestation @ 36 2/7 weeks. Pt was found to have worsening hypertension. PIH labs were done and urine protein/creatinine ratio was elevated. Pt was admitted for IOL due to worsening of BP. However with the urine result: dx superimposed preeclampsia. Pt had IV magnesium sulfate, pitocin induction and intracervical balloon placement. Early epidural placement. With removal of  Balloon, arom done on twin A. Cont mgmt. Led to progression of labor with SVD live female twin A, then AROM twin B but fetal bradycardia led to vacuum assistance. All done over intact perineum. Placenta spontaneous x two intact to path. Pt was continued on magnesium for 24 hr pp and had great diuresing. Uncomplicated postpartum course. See delivery note. Discharge Diagnoses: s/p vaginal delivery twin gestation( SVD twin A, vacuum extraction twin B), superimposed preeclmapsia  with chronic hypertension in pregnancy, twin gestation @ 36 2/7 wk  Discharge Information: Date: 08/03/2018 Activity: pelvic rest Diet: NAS diet Medications: Ibuprofen and topral, procardia, protonix, iron supplement, flexeril prn Condition: stable Instructions: refer to practice specific  booklet Discharge to: home Follow-up Information    Maxie Better, MD Follow up in 8 week(s).   Specialty:  Obstetrics and Gynecology Contact information: [address] [place] [address] [phone]           Newborn Data:   Trenesha, Spagnoli [[id number]]  Live born female  Birth Weight: 4 lb 15.9 oz (2265 g) APGAR: 8, 9  Newborn Delivery   Birth date/time:  [birth year]/01/2018 16:09:00 Delivery type:  Vaginal, Spontaneous      Nidhi, Meccia [[id number]]  Live born female  Birth Weight: 4 lb 12.4 oz (2165 g) APGAR: 9, 9  Newborn Delivery   Birth date/time:  [birth year]/2018 16:18:00 Delivery type:  Vaginal, Vacuum (Extractor)     Home with mother.  Abiha Lukehart A Shloka Baldridge 08/03/2018, 9:23 AM

## 2018-08-03 NOTE — Lactation Note (Signed)
This note was copied from a baby's chart. Lactation Consultation Note  Patient Name: Loretta Dixon WNUUV'O Date: 08/03/2018 Reason for consult: Follow-up assessment;Late-preterm 34-36.6wks;Infant < 6lbs  P4 mother whose twin boys are now 48 hours old.  These babies are LPTI weighing < 6 lbs.  Mother breast fed her last child (now 39 years old) for 14 months.  Infants were sleeping when I arrived.  Allowed time for questions/concerns and for mother to verbalize feelings about twins and feeding plan for home.  We discussed a workable plan that would allow babies to breast feed, supplement and mother to pump with downtime in between for rest.  Mother appreciative of assistance with this.  She feels like the babies are latching and feeding well and stated, "I have no problem with breast feeding!"  Encouraged her to continue hand expression before/after feedings and pumping to keep milk supply strong.  Mother eventually plans to breast feed both babies at the same time, however, I encouraged her to feed separately for a couple of weeks to focus on feeding well with each baby.  Mother agreeable to this plan.  We reviewed supplementation guidelines and mother verbalized understanding.    She has a DEBP for home use.  She also has our OP phone number for questions/concerns after discharge.  Mother's immediate support system includes her husband and 2 other children (ages 23 and 46 years old).  Mother is ready for discharge and excited about going home.     Maternal Data Formula Feeding for Exclusion: No Has patient been taught Hand Expression?: Yes Does the patient have breastfeeding experience prior to this delivery?: Yes  Feeding    LATCH Score                   Interventions    Lactation Tools Discussed/Used     Consult Status Consult Status: Complete Date: 08/03/18 Follow-up type: Call as needed    Weslyn Holsonback R Fenton Candee 08/03/2018, 11:55 AM

## 2018-08-03 NOTE — Discharge Instructions (Signed)
Call if temperature greater than equal to 100.4, nothing per vagina for 4-6 weeks or severe nausea vomiting, increased incisional pain , drainage or redness in the incision site, no straining with bowel movements, showers no bath °

## 2018-08-03 NOTE — Progress Notes (Signed)
PPD #2 SVD S; no complaint. ready to go home  O: BP 129/81 (BP Location: Left Arm)   Pulse 98   Temp 98.6 F (37 C) (Oral)   Resp 18   Ht 5\' 2"  (1.575 m)   Wt 81.2 kg   SpO2 99%   Breastfeeding Unknown   BMI 32.74 kg/m  Lungs clear to A Cor RRR Abd soft uterus 2FB above Pad scant blood Extr. +1 edema  outpt 3L  IMP: superimposed preeclampsia  With chronic HTN on topral/procardia voiding well. S/P  Gerd on protonix S/P twin delivery P) d/c home   cont current BP med( has BP monitor at home)  f/u 6-8 wk pp  d/c instructions reviewed

## 2018-08-03 NOTE — Progress Notes (Signed)
Pt discharged to home with husband and newborn sons.  Condition stable.  Pt ambulated to car with D. Gainey-Lloyd, NT.  No equipment for home ordered at discharge.

## 2018-09-15 ENCOUNTER — Telehealth (HOSPITAL_COMMUNITY): Payer: Self-pay | Admitting: Lactation Services

## 2018-09-15 NOTE — Telephone Encounter (Signed)
Left message for Mother that we would be happy to assist her if she would like to call back.  This is the second message left with mother of Twins 90 weeks old.

## 2018-09-18 NOTE — Telephone Encounter (Signed)
Mother phoned [facility] office with problems with baby A "Loretta Dixon". Weighed 4-15 at birth, and Baby B "Loretta Dixon" weighed 4-12 at birth.   Mother reports that at one month check up they both were 7 lbs each.  Mother has not had children weighed for 2 weeks. She does have infant scales at home. Explained how to do pre and post wt assessments.   Baby A, was always very sleepy at the breast. Baby "Loretta Dixon"doesn't empty breast.  Mother is concerned that he is having difficulty with latch . Infant will take 2-3 ounces after feedings that are bouncing on and off. If he has a good feeding he will only take one ounce of ebm with a bottle.   Concerned that infant is not transferring well.  Infant stools every other day seedy yellow. 10 or more wet diapers. Mother doesn't want to supplement with formula due to bad experiences with other children having milk allergies. Mother has cut out all dairy from her diet.   Baby B "Loretta Dixon"softens the breast, and seems to be nursing well. 8-10 times daily for wets and once daily seedy stool.   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  Mother reports that she pumps 3 times daily 10 ounces.  Mother was advised to follow up with [facility] dept for pre and post weight assessment.   Message sent to [facility] dept for follow up.

## 2018-09-23 ENCOUNTER — Ambulatory Visit: Payer: Self-pay

## 2018-09-23 NOTE — Lactation Note (Signed)
This note was copied from a baby's chart. Lactation Consultation Note  Patient Name: Loretta RuedKylen Micah Mckoy ZOXWR'UToday's Date: 09/23/2018     09/23/2018  Name: Loretta Dixon MRN: [id number] Date of Birth: 08/01/2019 Gestational Age: Gestational Age: 2318w2d Birth Weight: 79.9 oz Weight today:    8 pounds 13.9 ounces (4022 grams) with clean size 681 diaper  647 week old infant in with mom for feeding assessment. Mom is concerned with infant feeding pattern. Mom would like to only give breast milk to infants due to her older child having milk allergies. Infant is a twin and born LPT at 8436 w 2 d. He is 43 w 6 d adjusted.   Infant has gained 1938 grams in the last 51 days with an average daily weight gain of 38 grams a day. Infant is gaining weight well.   Mom has done a fantastic job maintaining her milk supply. She is pumping some to give infant bottles.   Mom reports infant is fussy all day and night and infant feeds about every 20 minutes during the day. Mom is supplementing with a bottle 3-4 x a day. Mom reports infant is fussy all day and night. Infant pulls on and off the breast with BF. Nipple is compressed after some feedings. Mom reports infant is cranky most of the day. Mom reports infant fussy after feeding, infant needs holding most of the time. Infant has had feeding issues since birth and is not improving. Mom reports infant used to be sleepy at the breast but is now very fussy. He is getting good volumes at the breast.   Infant latched to the breast with some disorganized suckle noted. Infant chomping at the breast. Mom reports he chomps on the bottle also. Infant with thick labial frenulum that inserts at the bottom of the gum ridge. Upper lip is tight with flanging. Infant with sucking blister to the center of upper lip. Infant with high palate. Infant with posterior lingual frenulum noted. Infant with good tongue extension at rest, infant with tongue retraction when suckling on gloved  finger. Infant with good lateralization and has decreased mid tongue elevation. Infant pulls on and off the breast. Infant with clicking on the breast. Infant with some spitting per mom, infant uncomfortable after feeding. Mom reports some pain with feedings, but not consistently. Infant disorganized on the breast and the bottle per mom. Infant has difficulty holding on to his pacifier. Infant feeds off and on all day and has to relatch several times with a feeding. Nipple was rounded post feeding today. Infant chokes and drools on the bottle. Discussed with mom that infant shows signs of a lip and posterior tongue restriction. Mom given website information and Local Provider info. Enc mom to educate herself and consider having infant evaluated by oral specialist.   Mom reports other infant does not have issues feeding. She reports she is not able to nurse both infants together as this infant needs more assistance with feeding. Mom reports she does alternate breasts with feeding. She is pumping to be able to give infant bottles.   Infant using the Tommie Tippee Slow Flow nipple. She reports infant chokes and drools on the bottle and is disorganized on the bottle. Enc mom to try the Dr. Theora GianottiBrown's Level 1 nipple and change to the Preemie nipple if he is choking or drooling. Mom voiced understanding.   Infant to follow up with Lactation as needed or 1-5 days post tongue and lip releases if completed.  General Information: Mother's reason for visit: Feeding assessment Consult: Initial Lactation consultant: Noralee Stain RN,IBCLC Breastfeeding experience: BF with all feedings, some supplementation with bottles Maternal medical conditions: Pregnancy induced hypertension, Infertility(Chronic HTN, Female fertility issues (IVF Babies)) Maternal medications: Pre-natal vitamin(Procardia, Metropralol, Vitamin D, Folic acid)  Breastfeeding History: Frequency of breast feeding: 2-3 x an hour around the  clock Duration of feeding: every 20 minutes during the day and is getting more bottles at night  Supplementation: Supplement method: bottle(Tommie Tippee, choking and drooling noted)         Breast milk volume: 3 ounces Breast milk frequency: 3-4 x a day   Pump type: Spectra Pump frequency: 3 x a day Pump volume: 3-7 ounces  Infant Output Assessment: Voids per 24 hours: 8+ Urine color: Clear yellow Stools per 24 hours: every other day Stool color: Yellow  Breast Assessment: Breast: Soft, Compressible Nipple: Erect Pain level: 1 Pain interventions: Bra, Expressed breast milk, Breast pump  Feeding Assessment: Infant oral assessment: Variance Infant oral assessment comment: see note Positioning: Cross cradle(10-15 minutes x 3, right breast) Latch: 2 - Grasps breast easily, tongue down, lips flanged, rhythmical sucking. Audible swallowing: 2 - Spontaneous and intermittent Type of nipple: 2 - Everted at rest and after stimulation Comfort: 1 - Filling, red/small blisters or bruises, mild/mod discomfort Hold: 2 - No assistance needed to correctly position infant at breast LATCH score: 9 Latch assessment: Deep Lips flanged: Yes Suck assessment: Displays both   Pre-feed weight: 4022 grams Post feed weight: 4114 grams Amount transferred: 92 ml Amount supplemented: 0  Additional Feeding Assessment:                                    Totals: Total amount transferred: 92 ml Total supplement given: 0 Total amount pumped post feed: did not pump  1. Offer infant the breast with feeding cues 2. Offer a bottle of pumped breast milk after breast feeding if he is cueing feed 3. Infant needs about 75-100 ml (2.5-3.5 ounces) for 8 feedings a day or 600-800 ml (20-27 ounces) in 24 hours. Infant may take more or less depending on how often she feeds.  4. Feed infant using a slow flow nipple and used pace bottle method (video on [email or web address]) 5. Try the Dr. Theora Gianotti  Level 1 nipple, if he is still choking and drooling change to the Preemie Nipple 6. Consider having infant evaluated by Oral Specialist 7. Thank you for allowing me to assist you today 8. Please call with questions/concerns as needed [phone]) 9. Follow up with Lactation as needed or 1-5 days post tongue and lip releases if needed    [facility] RN, IBCLC                                                        Loretta Dixon 09/23/2018, 10:14 AM

## 2018-10-06 ENCOUNTER — Ambulatory Visit: Payer: Self-pay

## 2018-10-06 NOTE — Lactation Note (Signed)
This note was copied from a baby's chart. Lactation Consultation Note  Patient Name: Loretta Dixon AVWUJ'WToday's Date: 10/06/2018     10/06/2018  Name: Loretta RuedKylen Micah Savitt MRN: [id number] Date of Birth: 08/01/2019 Gestational Age: Gestational Age: 5351w2d Birth Weight: 79.9 oz Weight today:    9 pounds 13.1 ounces (4452 grams) with clean size 261 diaper  592 month old infant presents today with mom for follow up feeding assessment. Infant post tongue and lip releases on Monday 10/04/18.   Infant has gained 430 grams in the last 13 days with an average daily weight gain of 33 grams a day.   Mom reports infant is latching with more suction and is latching for longer periods of time without pulling on and off. She reports about every other feeding has improved. Discussed it may take 2-3 weeks for infant to improve completely.   Parents are performing tongue and lip stretches per Dr. Orland MustardMcMurtry.   Infant with granulation tissue to upper lip. Upper lip flanges better today. Infant with diamond shaped granulation tissue noted. Infant with increased mid tongue elevation. Discussed the normal healing process and that it may take 2-4 weeks to see improvement with breast feeding.   Mom shown how to do suck training exercises and enc to do 5-6 x a day for 1-2 minutes per exercise for 2-3 weeks until suck improves.   Infant to follow up with Dr. Orland MustardMcMurtry via telephone consult. Infant to follow up with Dr. Maisie Fushomas on 6/18 for 2 month appt. [facility]   General Information: Mother's reason for visit: follow up feeding assessment, post tongue and lip releases on 6/15 Consult: Follow-up Lactation consultant: Noralee StainSharon Hice RN,IBCLC Breastfeeding experience: latching has improved   Maternal medications: Pre-natal vitamin(Folic Acid and Vitamin D)  Breastfeeding History: Frequency of breast feeding: every 1.5-2 hours Duration of feeding: 5  minutes  Supplementation:                 Pump type: Medela pump in style Pump frequency: not pumping since Monday    Infant Output Assessment: Voids per 24 hours: 8+ Urine color: Clear yellow Stools per 24 hours: every other day Stool color: Yellow  Breast Assessment: Breast: Soft, Compressible Nipple: Erect Pain level: 0 Pain interventions: Bra  Feeding Assessment: Infant oral assessment: Variance Infant oral assessment comment: see note Positioning: Cradle(right breast, < 10 minutes) Latch: 2 - Grasps breast easily, tongue down, lips flanged, rhythmical sucking. Audible swallowing: 2 - Spontaneous and intermittent Type of nipple: 2 - Everted at rest and after stimulation Comfort: 2 - Soft/non-tender Hold: 2 - No assistance needed to correctly position infant at breast LATCH score: 10 Latch assessment: Shallow Lips flanged: Yes Suck assessment: Displays both   Pre-feed weight: 4452 grams Post feed weight: 4502 grams Amount transferred: 50 ml Amount supplemented: 0  Additional Feeding Assessment:                                    Totals: Total amount transferred: 50 ml Total supplement given: 0 Total amount pumped post feed: did not pump   Plan:  1. Offer breast with feeding cues as you have been 2. Continue tongue and lip stretches and Dr Orland MustardMcMurtry instructed 3. Begin suck training exercises per handout 5-6 x a day for 1-2 minutes per exercised for 2-3 weeks until suck has improved 4. Keep  up the good work 5. Thank you for allowing me to assist you today 6. Please call with any questions/concerns as needed [phone] 7. Follow up with Lactation in 2 weeks  [place], IBCLC                                                    Donn Pierini 10/06/2018, 2:21 PM

## 2018-10-20 ENCOUNTER — Ambulatory Visit: Payer: Self-pay

## 2018-10-20 NOTE — Lactation Note (Signed)
This note was copied from a baby's chart. Lactation Consultation Note  Patient Name: Loretta Dixon IDPOE'U Date: 10/20/2018     10/20/2018  Name: Veona Bittman MRN: [id number] Date of Birth: 08/01/2019 Gestational Age: Gestational Age: [redacted]w[redacted]d Birth Weight: 79.9 oz Weight today:    10 pounds 10 ounces (4820 grams) with clean size 28 diaper  21 month old infant presents today with mom for follow up feeding assessment. Infant 16 days post tongue and lip releases.  Infant has gained 368 grams in the last 14 days with an average daily weight gain of 26 grams a day.   Infant was followed up by Dr. Verdene Lennert and was told to continue stretches for another 2 weeks and to continue suck training. They are having difficulty getting all the suck training in as she is so busy with feeding and her other town. Infant does fall asleep easier and is more relaxed at the breast.   Mom reports infant is a happier infant and is able to be laid down more often. Infant is feeding at the breast every 1-3 hours per mom. He takes one breast per feeding. Infant is voiding and stooling well. Mom keeps infants upright after feeding.   Infant with healed upper lip. Upper lip flanges better and on the breast. Infant with scab still present under tongue, scab is coming loose. Tongue stretches well. Infant with tongue thrusting on the breast, bottle and on gloved finger. Infant gaggy at times. Discussed trying the Dr. Saul Fordyce more narrow nipple to try for infant as mom reports infant is not doing well on the Tommie Tippee bottle and drools a lot. Infant elevating tongue much better.   Mom is not able to nurse the twins together. She reports that since [place] still not able to maintain a good latch. She has tried more recently.   Discussed with mom it can take up to about 4 weeks for some infants to show full improvement and to continue suck training and stretches as prescribed.    Infant to follow up with Dr. Berline Lopes  in August at 4 months. Infant to follow up with Lactation in 2 weeks at mom's request.     General Information: Mother's reason for visit: Follow up feeding assessment. 16 days post tongue and lip releases Consult: Follow-up Lactation consultant: Nonah Mattes RN,IBCLC Breastfeeding experience: immproving with BF slowly   Maternal medications: Pre-natal vitamin(Folic Acid, Vitamin D, BP meds)  Breastfeeding History: Frequency of breast feeding: every 1-3 hours Duration of feeding: 10-20 minutes  Supplementation: Supplement method: bottle(Tommie Tippee)         Breast milk volume: 2-3 ounces Breast milk frequency: 3 x a week   Pump type: Medela pump in style Pump frequency: 1 x every other day Pump volume: 8 ounces  Infant Output Assessment: Voids per 24 hours: 8+ Urine color: Clear yellow Stools per 24 hours: 1-2 Stool color: Yellow  Breast Assessment: Breast: Soft, Compressible Nipple: Erect Pain level: 1 Pain interventions: Bra, Breast pump, Coconut oil  Feeding Assessment: Infant oral assessment: Variance Infant oral assessment comment: see note Positioning: Cradle(right breast 10 minutes) Latch: 1 - Repeated attempts needed to sustain latch, nipple held in mouth throughout feeding, stimulation needed to elicit sucking reflex. Audible swallowing: 2 - Spontaneous and intermittent Type of nipple: 2 - Everted at rest and after stimulation Comfort: 1 - Filling, red/small blisters or bruises, mild/mod discomfort Hold: 2 - No assistance needed to correctly position infant at breast LATCH score: 8  Latch assessment: Deep Lips flanged: Yes Suck assessment: Displays both   Pre-feed weight: 4820 grams Post feed weight: 4882 grams Amount transferred: 62 ml Amount supplemented: 0  Additional Feeding Assessment:                                    Totals: Total amount transferred: 62 ml Total supplement given: 0 Total amount pumped post feed: did not  pump  1. Offer breast with feeding cues as you have been 2. Continue tongue and lip stretches and Dr Orland MustardMcMurtry instructed 3. Begin suck training exercises per handout 5-6 x a day for 1-2 minutes per exercised for 2-3 weeks until suck has improved 4. Keep up the good work 5. Thank you for allowing me to assist you today 6. Please call with any questions/concerns as needed [phone] 7. Follow up with Lactation in 2 weeks  [facility] Kayzlee Wirtanen RN, IBCLC                                                    Silas FloodSharon S Tamorah Hada 10/20/2018, 8:21 AM

## 2019-07-22 ENCOUNTER — Ambulatory Visit: Payer: No Typology Code available for payment source | Attending: Internal Medicine

## 2019-07-22 DIAGNOSIS — Z23 Encounter for immunization: Secondary | ICD-10-CM

## 2019-07-22 NOTE — Progress Notes (Signed)
   Covid-19 Vaccination Clinic  Name:  Loretta Dixon    MRN: [id number] DOB: Nov 03, 1980  07/22/2019  [name] was observed post Covid-19 immunization for 15 minutes without incident. She was provided with Vaccine Information Sheet and instruction to access the V-Safe system.   [name] was instructed to call 911 with any severe reactions post vaccine: Marland Kitchen Difficulty breathing  . Swelling of face and throat  . A fast heartbeat  . A bad rash all over body  . Dizziness and weakness   Immunizations Administered    Name Date Dose VIS Date Route   Pfizer COVID-19 Vaccine 07/22/2019  1:20 PM 0.3 mL 04/01/2019 Intramuscular   Manufacturer: [employer], [employer]   Lot: [id number]   NDC: [address]-0

## 2019-08-17 ENCOUNTER — Ambulatory Visit: Payer: 59 | Attending: Internal Medicine

## 2019-08-17 DIAGNOSIS — Z23 Encounter for immunization: Secondary | ICD-10-CM

## 2019-08-17 NOTE — Progress Notes (Signed)
   Covid-19 Vaccination Clinic  Name:  Loretta Dixon    MRN: [id number] DOB: 02-Apr-1981  08/17/2019  [name] was observed post Covid-19 immunization for 15 minutes without incident. She was provided with Vaccine Information Sheet and instruction to access the V-Safe system.   [name] was instructed to call 911 with any severe reactions post vaccine: Marland Kitchen Difficulty breathing  . Swelling of face and throat  . A fast heartbeat  . A bad rash all over body  . Dizziness and weakness   Immunizations Administered    Name Date Dose VIS Date Route   Pfizer COVID-19 Vaccine 08/17/2019 10:59 AM 0.3 mL 06/15/2018 Intramuscular   Manufacturer: [employer], [employer]   Lot: [id number]   NDC: [address]-4

## 2020-01-24 IMAGING — US US ABDOMEN LIMITED
1 series · 14 of 25 positions shown · non-contrast
Comparison: None.

CLINICAL DATA: Postprandial epigastric pain for 5 weeks.

EXAM:
ULTRASOUND ABDOMEN LIMITED RIGHT UPPER QUADRANT

[Series 1: us abdomen limited · 14 of 52 slices shown]
[im 1/52]
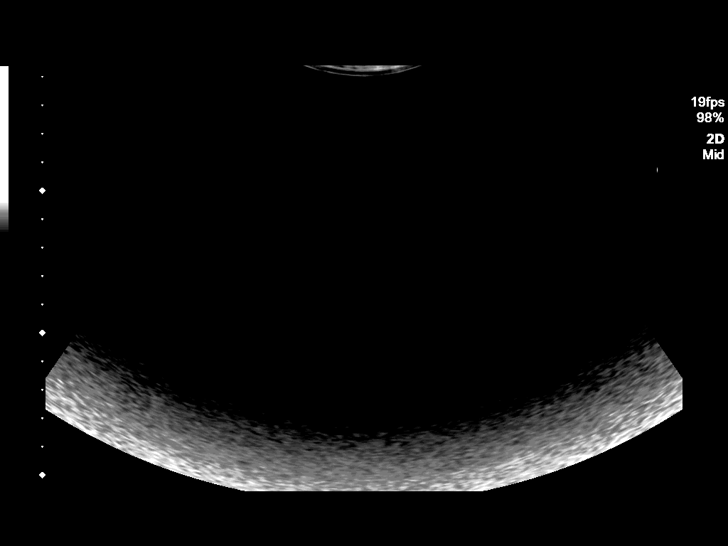
[im 5/52]
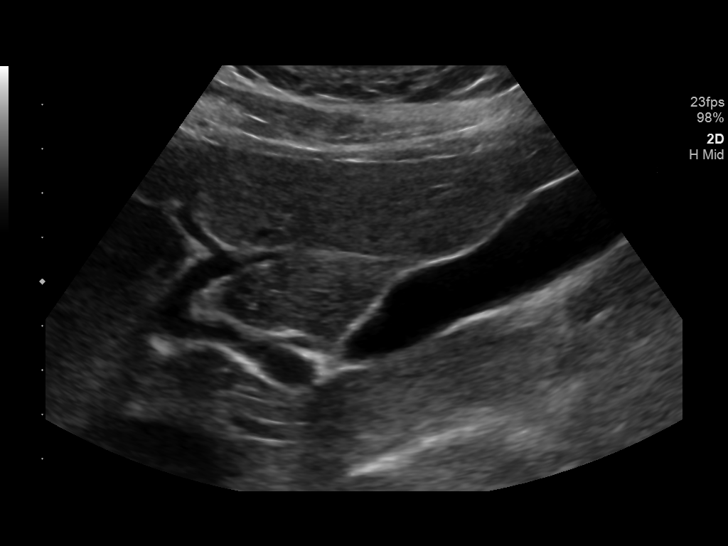
[im 9/52]
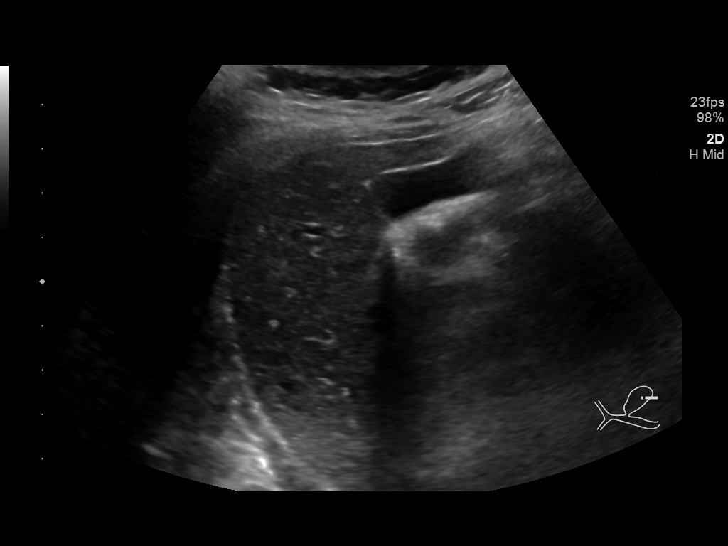
[im 13/52]
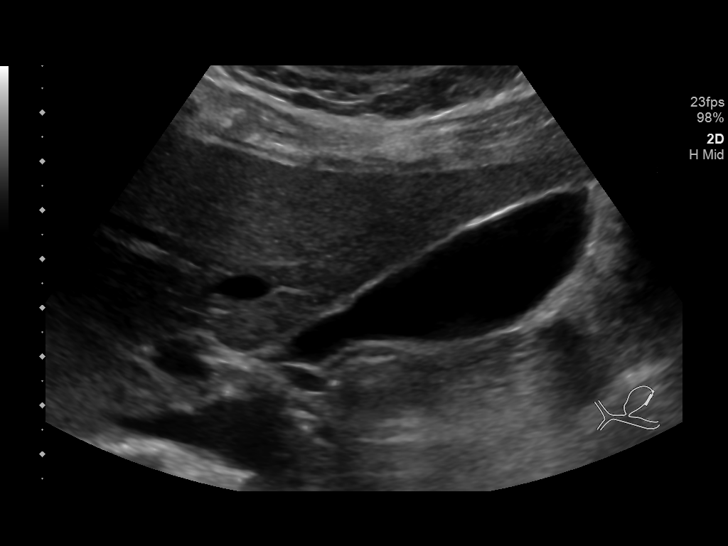
[im 18/52]
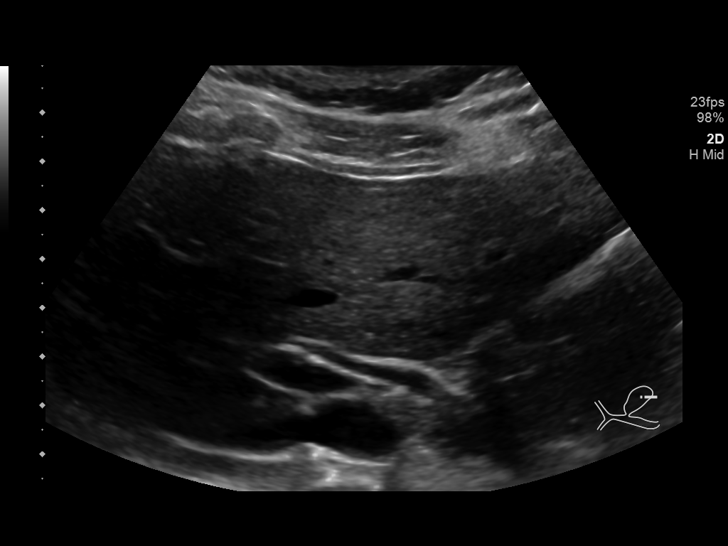
[im 20/52]
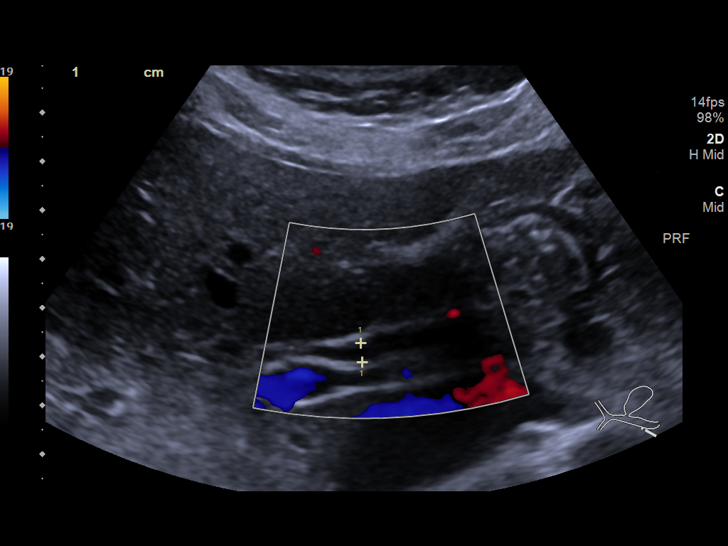
[im 24/52]
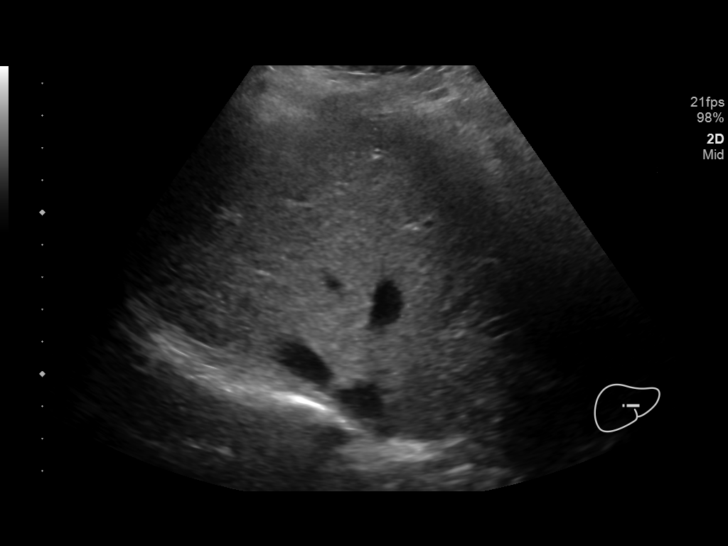
[im 28/52]
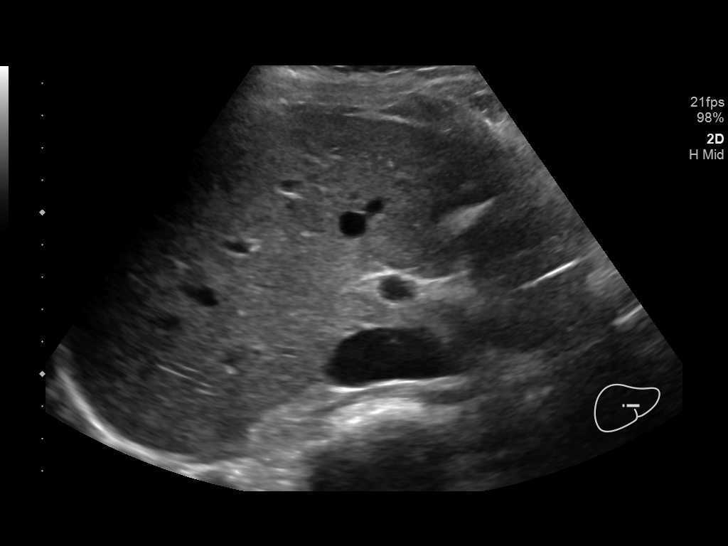
[im 32/52]
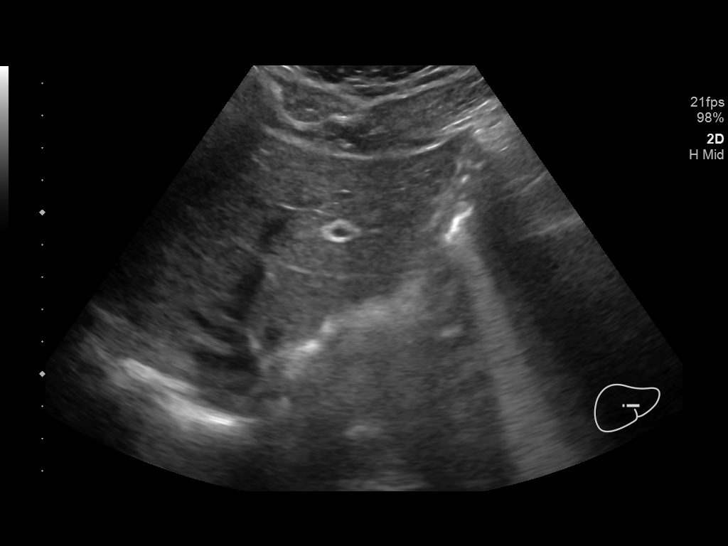
[im 35/52]
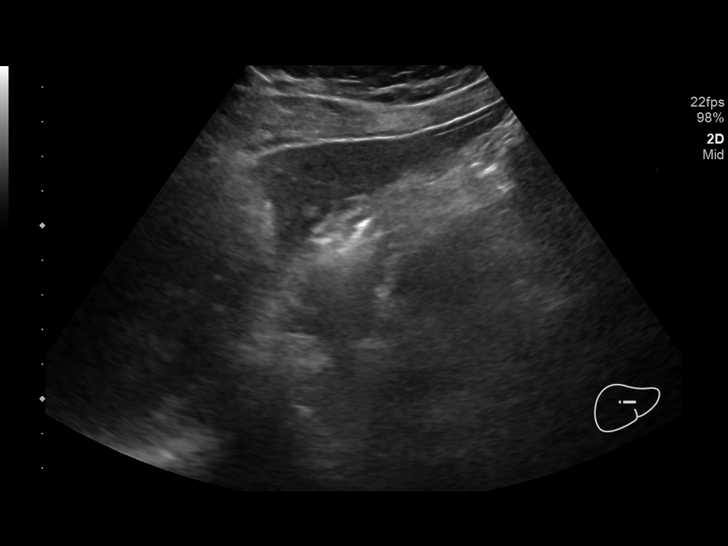
[im 39/52]
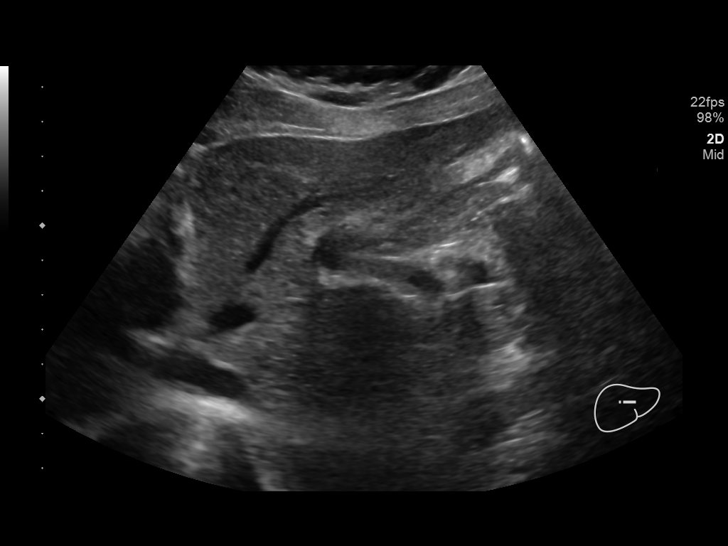
[im 43/52]
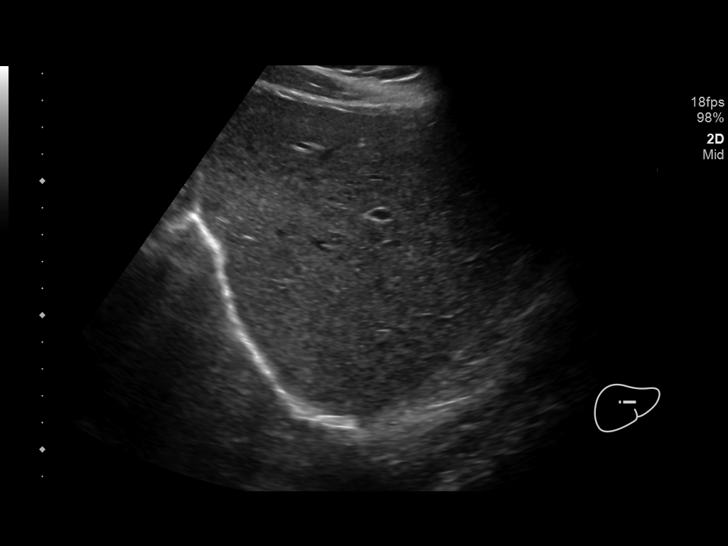
[im 47/52]
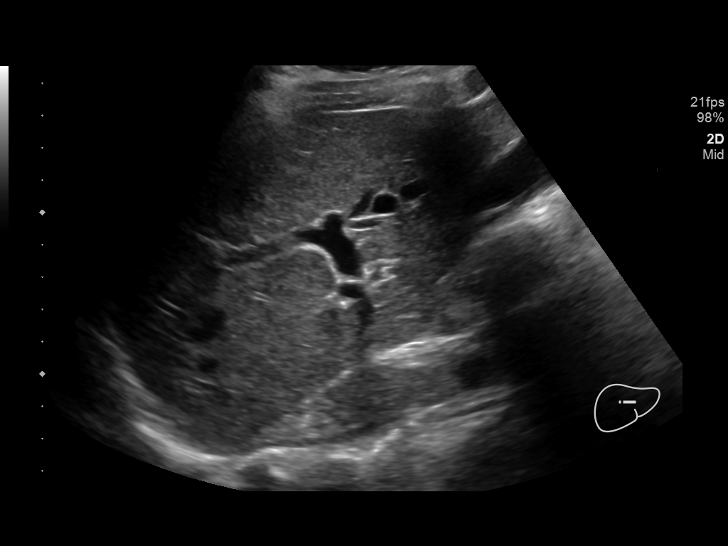
[im 52/52]
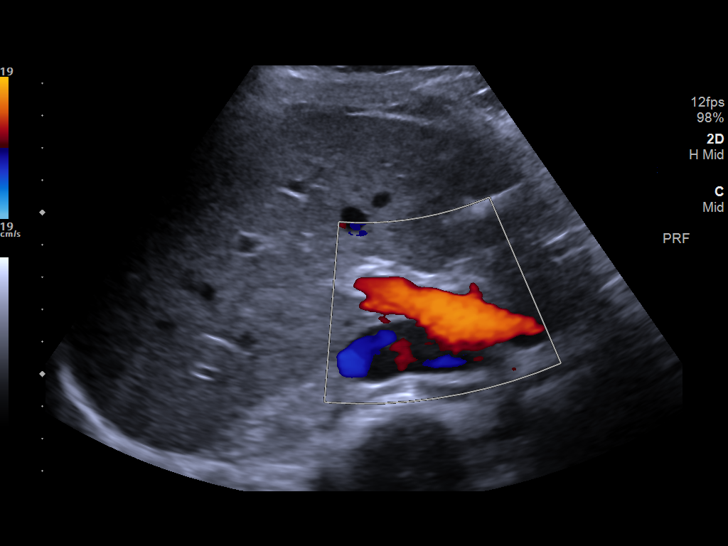

[14 of 25 positions shown; findings below may reference images not displayed]

FINDINGS: Gallbladder:

No gallstones or wall thickening visualized. No sonographic Murphy
sign noted by sonographer.

Common bile duct:

Diameter: 4 mm, within normal limits.

Liver:

No focal lesion identified. Within normal limits in parenchymal
echogenicity. Portal vein is patent on color Doppler imaging with
normal direction of blood flow towards the liver.
IMPRESSION: Negative.  No hepatobiliary abnormality identified.

## 2020-03-13 IMAGING — US US MFM OB TRANSVAGINAL
1 series · 14 of 28 positions shown · non-contrast
Comparison: none

[Series 1: us mfm ob transvaginal · 65 acquisitions, 14 frames shown]
[im 3/65]
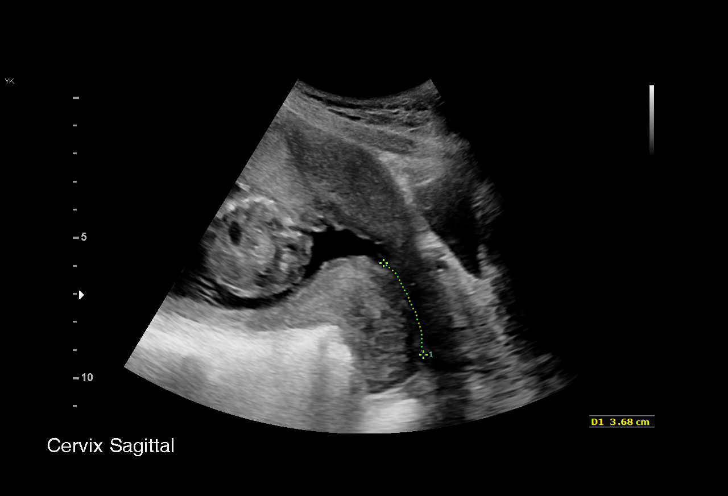
[im 8/65]
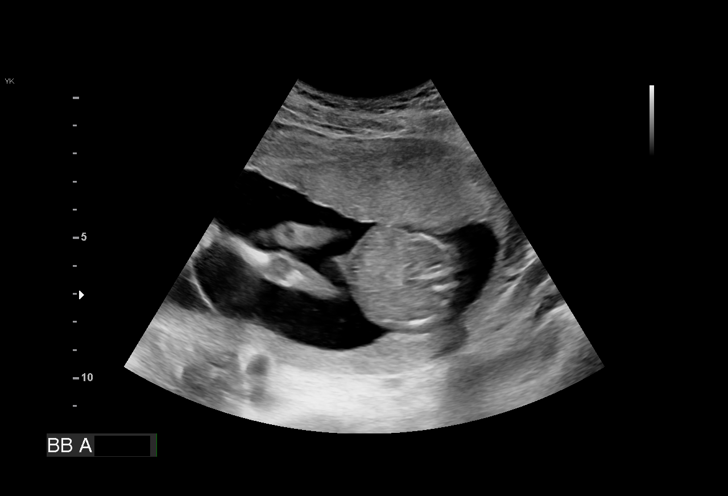
[im 12/65]
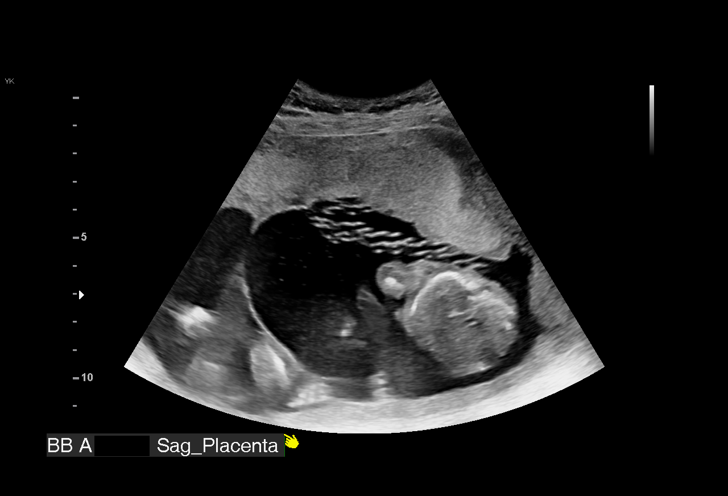
[im 17/65]
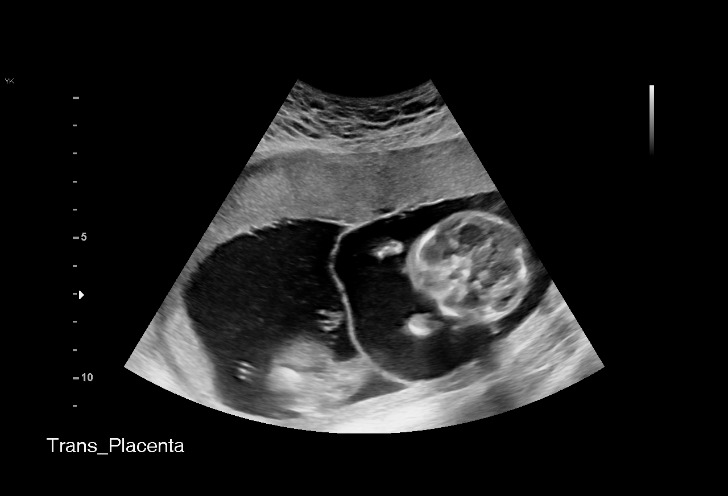
[im 22/65]
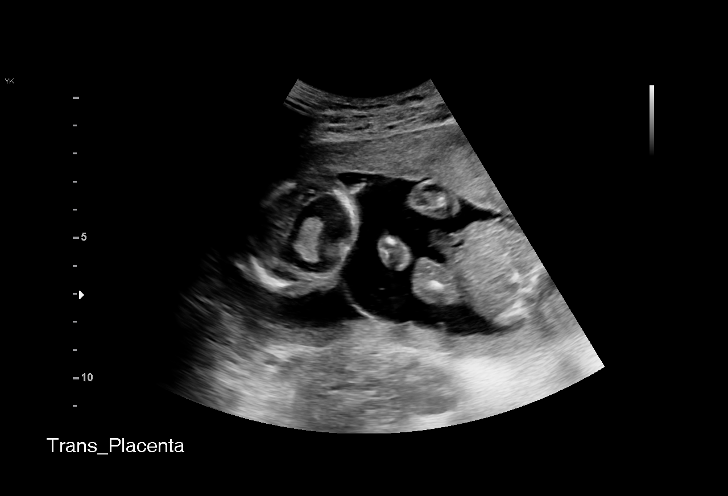
[im 27/65]
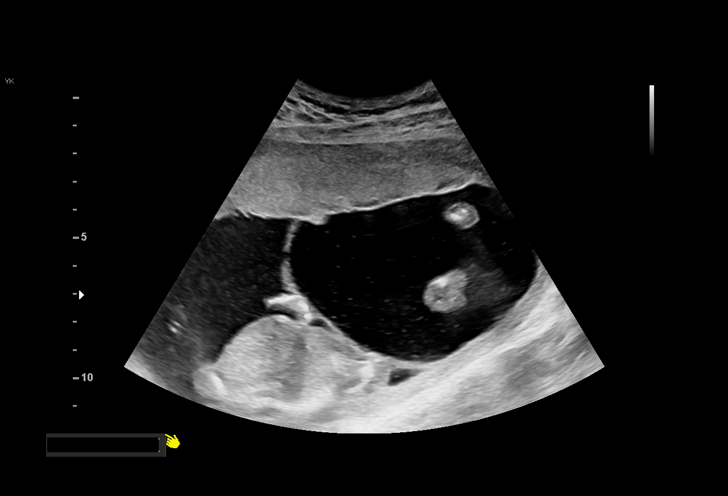
[im 31/65]
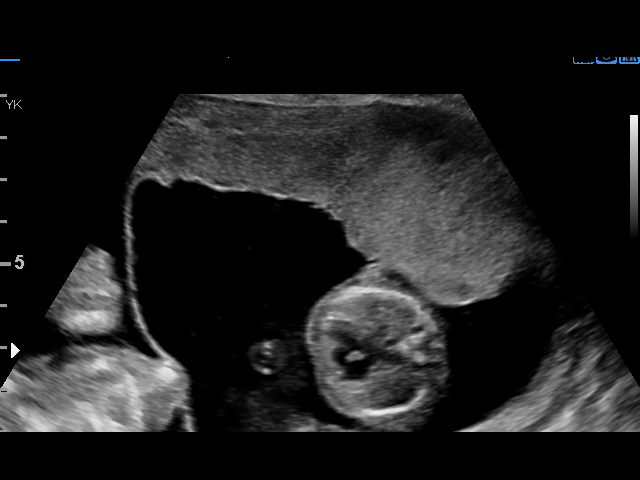
[im 36/65]
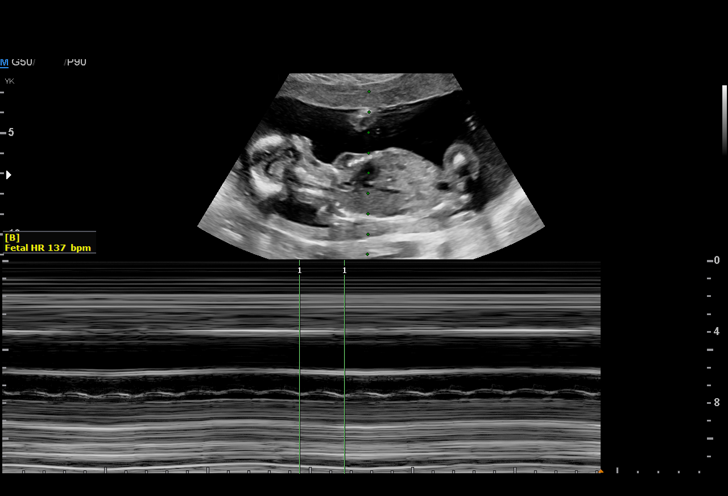
[im 41/65]
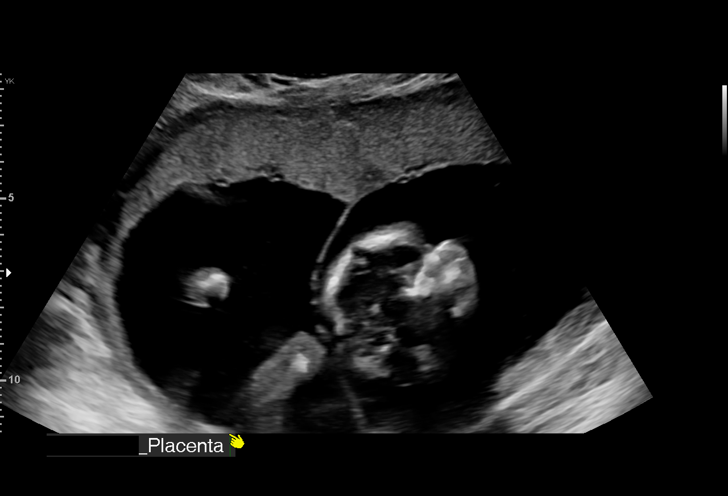
[im 46/65]
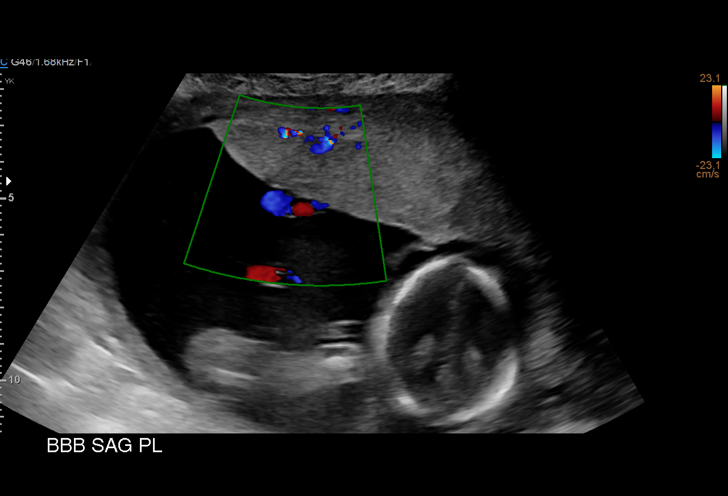
[im 50/65]
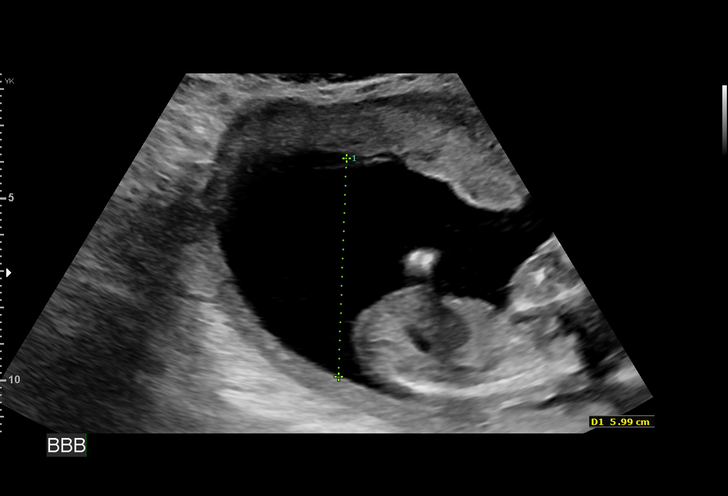
[im 55/65]
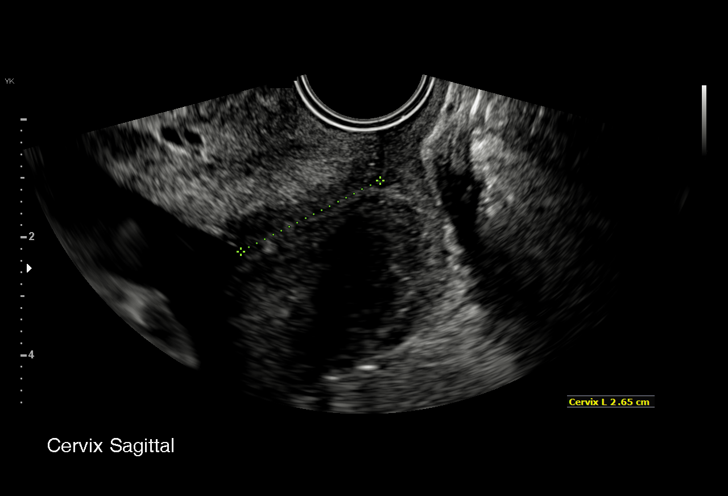
[im 60/65]
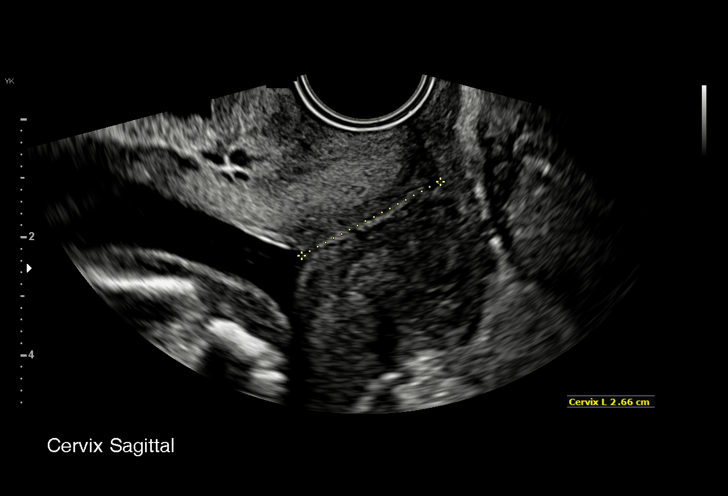
[im 65/65]
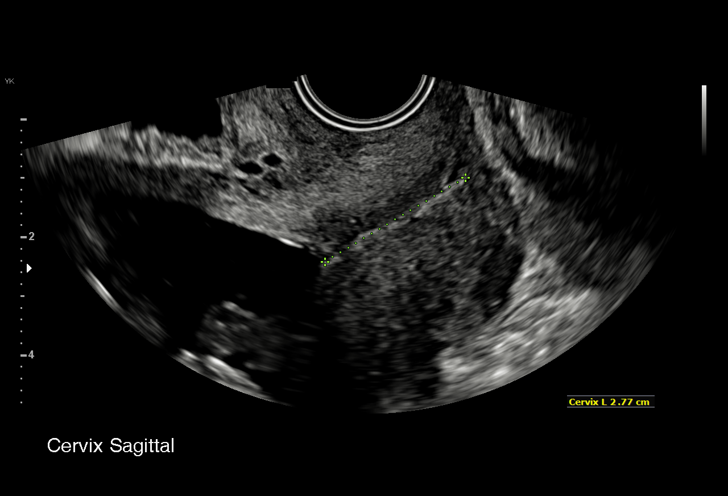

[14 of 28 positions shown; findings below may reference images not displayed]

Performed By:     Joelito Sarmiento        Secondary Phy.:    [facility]
                   MAU/Triage

  1  US MFM OB LIMITED                    76815.01     [facility]
                                                       [facility]
  2  US MFM OB TRANSVAGINAL               76817.2      [facility]
                                                       [facility]
 ----------------------------------------------------------------------

 ----------------------------------------------------------------------
Indications

  Encounter for cervical length
  Twin pregnancy, di/di, second trimester
  Poor obstetric history: Previous preterm
  delivery, antepartum
  17 weeks gestation of pregnancy
 ----------------------------------------------------------------------
Fetal Evaluation (Fetus A)

 Num Of Fetuses:          2
 Fetal Heart Rate(bpm):   154
 Cardiac Activity:        Observed
 Fetal Lie:               Maternal left side
 Presentation:            Breech
 Placenta:                Anterior
 P. Cord Insertion:       Visualized, central
 Membrane Desc:      Dividing Membrane seen

 Amniotic Fluid
 AFI FV:      Within normal limits

                             Largest Pocket(cm)

OB History
 Gravidity:    4         Term:   2        Prem:   1
 Living:       3
Gestational Age (Fetus A)

 LMP:           17w 1d        Date:  11/20/17                 EDD:   08/27/18
 Best:          17w 1d     Det. By:  LMP  (11/20/17)          EDD:   08/27/18
Anatomy (Fetus A)

 Ventricles:            Appears normal         Stomach:                Appears normal, left
                                                                       sided
 Choroid Plexus:        Appears normal         Bladder:                Appears normal

Fetal Evaluation (Fetus B)

 Num Of Fetuses:          2
 Fetal Heart Rate(bpm):   137
 Cardiac Activity:        Observed
 Fetal Lie:               Maternal right side
 Presentation:            Transverse, head to maternal right
 Placenta:                Anterior
 P. Cord Insertion:       Visualized, central
 Membrane Desc:      Dividing Membrane seen

 Amniotic Fluid
 AFI FV:      Within normal limits

                             Largest Pocket(cm)

Gestational Age (Fetus B)

 LMP:           17w 1d        Date:  11/20/17                 EDD:   08/27/18
 Best:          17w 1d     Det. By:  LMP  (11/20/17)          EDD:   08/27/18
Anatomy (Fetus B)

 Stomach:               Appears normal         Bladder:                Appears normal
Cervix Uterus Adnexa

 Cervix
 Length:           2.66  cm.
 Normal appearance by transvaginal scan

 Uterus
 No abnormality visualized.

 Left Ovary
 Not visualized.

 Right Ovary
 Not visualized.

 Cul De Sac
 No free fluid seen.
 Adnexa
 No abnormality visualized.
Impression

 Cervical length is wiithin normal limits

Recommendations

 Follow up as clinically indicated.
 Consider follow up cervical length in 2 weeks given patients
 prior history.

## 2022-11-13 ENCOUNTER — Encounter (HOSPITAL_BASED_OUTPATIENT_CLINIC_OR_DEPARTMENT_OTHER): Payer: Self-pay | Admitting: Emergency Medicine

## 2022-11-13 ENCOUNTER — Emergency Department (HOSPITAL_BASED_OUTPATIENT_CLINIC_OR_DEPARTMENT_OTHER)
Admission: EM | Admit: 2022-11-13 | Discharge: 2022-11-13 | Disposition: A | Discharge status: Home / Self Care | Payer: No Typology Code available for payment source | Attending: Emergency Medicine | Admitting: Emergency Medicine

## 2022-11-13 ENCOUNTER — Emergency Department (HOSPITAL_BASED_OUTPATIENT_CLINIC_OR_DEPARTMENT_OTHER): Payer: No Typology Code available for payment source

## 2022-11-13 ENCOUNTER — Other Ambulatory Visit: Payer: Self-pay

## 2022-11-13 DIAGNOSIS — R1031 Right lower quadrant pain: Secondary | ICD-10-CM | POA: Diagnosis not present

## 2022-11-13 DIAGNOSIS — Z79899 Other long term (current) drug therapy: Secondary | ICD-10-CM | POA: Diagnosis not present

## 2022-11-13 DIAGNOSIS — I10 Essential (primary) hypertension: Secondary | ICD-10-CM | POA: Diagnosis not present

## 2022-11-13 DIAGNOSIS — R102 Pelvic and perineal pain: Secondary | ICD-10-CM | POA: Diagnosis present

## 2022-11-13 LAB — CBC WITH DIFFERENTIAL/PLATELET
Abs Immature Granulocytes: 0.02 10*3/uL (ref 0.00–0.07)
Basophils Absolute: 0 10*3/uL (ref 0.0–0.1)
Basophils Relative: 0 %
Eosinophils Absolute: 0.1 10*3/uL (ref 0.0–0.5)
Eosinophils Relative: 3 %
HCT: 35.8 % — ABNORMAL LOW (ref 36.0–46.0)
Hemoglobin: 11.1 g/dL — ABNORMAL LOW (ref 12.0–15.0)
Immature Granulocytes: 0 %
Lymphocytes Relative: 33 %
Lymphs Abs: 1.7 10*3/uL (ref 0.7–4.0)
MCH: 25.2 pg — ABNORMAL LOW (ref 26.0–34.0)
MCHC: 31 g/dL (ref 30.0–36.0)
MCV: 81.4 fL (ref 80.0–100.0)
Monocytes Absolute: 0.6 10*3/uL (ref 0.1–1.0)
Monocytes Relative: 11 %
Neutro Abs: 2.8 10*3/uL (ref 1.7–7.7)
Neutrophils Relative %: 53 %
Platelets: 257 10*3/uL (ref 150–400)
RBC: 4.4 MIL/uL (ref 3.87–5.11)
RDW: 15.8 % — ABNORMAL HIGH (ref 11.5–15.5)
WBC: 5.3 10*3/uL (ref 4.0–10.5)
nRBC: 0 % (ref 0.0–0.2)

## 2022-11-13 LAB — URINALYSIS, ROUTINE W REFLEX MICROSCOPIC
Bacteria, UA: NONE SEEN
Bilirubin Urine: NEGATIVE
Glucose, UA: NEGATIVE mg/dL
Ketones, ur: NEGATIVE mg/dL
Leukocytes,Ua: NEGATIVE
Nitrite: NEGATIVE
Protein, ur: NEGATIVE mg/dL
Specific Gravity, Urine: 1.016 (ref 1.005–1.030)
pH: 7.5 (ref 5.0–8.0)

## 2022-11-13 LAB — COMPREHENSIVE METABOLIC PANEL
ALT: 38 U/L (ref 0–44)
AST: 33 U/L (ref 15–41)
Albumin: 4.7 g/dL (ref 3.5–5.0)
Alkaline Phosphatase: 72 U/L (ref 38–126)
Anion gap: 7 (ref 5–15)
BUN: 13 mg/dL (ref 6–20)
CO2: 27 mmol/L (ref 22–32)
Calcium: 9.7 mg/dL (ref 8.9–10.3)
Chloride: 103 mmol/L (ref 98–111)
Creatinine, Ser: 1 mg/dL (ref 0.44–1.00)
GFR, Estimated: >60 mL/min (ref >60)
Glucose, Bld: 88 mg/dL (ref 70–99)
Potassium: 4.2 mmol/L (ref 3.5–5.1)
Sodium: 137 mmol/L (ref 135–145)
Total Bilirubin: 0.4 mg/dL (ref 0.3–1.2)
Total Protein: 8.2 g/dL — ABNORMAL HIGH (ref 6.5–8.1)

## 2022-11-13 LAB — PREGNANCY, URINE: Preg Test, Ur: NEGATIVE

## 2022-11-13 LAB — LIPASE, BLOOD: Lipase: 26 U/L (ref 11–51)

## 2022-11-13 MED ORDER — KETOROLAC TROMETHAMINE 15 MG/ML IJ SOLN
15.0000 mg | Freq: Once | INTRAMUSCULAR | Status: AC
  Administered 2022-11-13: 15 mg via INTRAVENOUS
  Filled 2022-11-13: qty 1

## 2022-11-13 MED ORDER — IOHEXOL 300 MG/ML  SOLN
100.0000 mL | Freq: Once | INTRAMUSCULAR | Status: AC | PRN
  Administered 2022-11-13: 75 mL via INTRAVENOUS

## 2022-11-13 NOTE — ED Provider Notes (Signed)
  Physical Exam  BP 124/84   Pulse 71   Temp 97.8 F (36.6 C) (Oral)   Resp 18   LMP 10/23/2022 (Exact Date)   SpO2 97%   Physical Exam Vitals and nursing note reviewed.  Constitutional:      General: She is not in acute distress.    Appearance: She is well-developed.  HENT:     Head: Normocephalic and atraumatic.  Eyes:     Conjunctiva/sclera: Conjunctivae normal.  Cardiovascular:     Rate and Rhythm: Normal rate and regular rhythm.     Heart sounds: No murmur heard. Pulmonary:     Effort: Pulmonary effort is normal. No respiratory distress.     Breath sounds: Normal breath sounds.  Abdominal:     Palpations: Abdomen is soft.     Tenderness: There is no abdominal tenderness.  Musculoskeletal:        General: No swelling.     Cervical back: Neck supple.  Skin:    General: Skin is warm and dry.     Capillary Refill: Capillary refill takes less than 2 seconds.  Neurological:     Mental Status: She is alert.  Psychiatric:        Mood and Affect: Mood normal.     Procedures  Procedures  ED Course / MDM   Clinical Course as of 11/13/22 2000  Thu Nov 13, 2022  1841 Pending ultrasound. If [place] normal, OBGYN follow up.  [CG]    Clinical Course User Index [CG] Al Decant, PA-C   Medical Decision Making Amount and/or Complexity of Data Reviewed Labs: ordered. Radiology: ordered.  Risk Prescription drug management.   42 year old female signed out to me pending ultrasound imaging study.  Please see previous Friday note for further details.  Patient ultrasound imaging negative for torsion.  The patient will follow-up with her OB/GYN for reassessment per previous provider plan.  The patient will return to the ED with any new or worsening signs or symptoms.  The patient is amenable to this plan.  She is currently pain-free and requesting discharge.  Patient discharged in stable condition.       Al Decant, PA-C 11/13/22 2000    Theresia Lo,  [place] K, DO 11/13/22 [phone]

## 2022-11-13 NOTE — ED Triage Notes (Signed)
Pt presents to ED POV. Pt c/o R pelvic pain x2d. Pt reports that is has progressively worsened. Pt reports spotting that also began 2d ago. Pt reports LMP 7/4, GYN sent here for ultrasound

## 2022-11-13 NOTE — Discharge Instructions (Signed)
It was a pleasure taking part in your care today.  As discussed, your workup here was unremarkable, your ultrasound imaging was negative.  Please follow-up with your OB/GYN on Monday for reassessment.  Please return to the ED with any new or worsening signs or symptoms.

## 2022-11-13 NOTE — ED Provider Notes (Signed)
[place] DEPARTMENT AT [facility] Provider Note   CSN: [id number] Arrival date & time: 11/13/22  1407     History  Chief Complaint  Patient presents with   Pelvic Pain    Loretta Dixon is a 42 y.o. female.  Patient with history of hypertension presents today with complaints of abdominal pain. She states that same is in the RLQ of her abdomen. States that it began initially on Tuesday and has been consistently worsening since. Denies any history of similar symptoms previously. No history of abdominal surgeries. Denies any nausea, vomiting, or diarrhea. She is having regular bowel movements. Also notes that she has had some spotting as well. LMP was on 7/4 and was normal for her. Called her OB/GYN who recommended she come here for evaluation. Denies any urinary symptoms or vaginal discharge. She is sexually active.   The history is provided by the patient. No language interpreter was used.  Pelvic Pain       Home Medications Prior to Admission medications   Medication Sig Start Date End Date Taking? Authorizing Provider  cyclobenzaprine (FLEXERIL) 10 MG tablet Take 1 tablet (10 mg total) by mouth 3 (three) times daily as needed for muscle spasms. 07/01/18   Maxie Better, MD  docusate sodium (COLACE) 100 MG capsule Take 100 mg by mouth daily.    [provider]  folic acid (FOLVITE) 1 MG tablet Take 1 tablet by mouth daily. 04/28/12   [provider]  ibuprofen (ADVIL,MOTRIN) 800 MG tablet Take 1 tablet (800 mg total) by mouth every 8 (eight) hours as needed for mild pain or moderate pain. 08/03/18   Maxie Better, MD  metoprolol succinate (TOPROL-XL) 50 MG 24 hr tablet Take 50 mg by mouth daily. Take with or immediately following a meal.    [provider]  NIFEdipine (ADALAT CC) 60 MG 24 hr tablet Take 1 tablet (60 mg total) by mouth daily. 05/11/18   Maxie Better, MD  pantoprazole (PROTONIX) 40 MG tablet Take 40 mg  by mouth daily. 06/08/18   [provider]  Prenatal Vit-Fe Fumarate-FA (PRENATAL MULTIVITAMIN) TABS tablet Take 1 tablet by mouth daily at 12 noon.    [provider]      Allergies    Labetalol    Review of Systems   Review of Systems  Genitourinary:  Positive for pelvic pain.  All other systems reviewed and are negative.   Physical Exam Updated Vital Signs BP 130/83 (BP Location: Right Arm)   Pulse 79   Temp 98.4 F (36.9 C)   Resp 18   LMP 10/23/2022 (Exact Date)   SpO2 100%  Physical Exam Vitals and nursing note reviewed.  Constitutional:      General: She is not in acute distress.    Appearance: Normal appearance. She is normal weight. She is not ill-appearing, toxic-appearing or diaphoretic.  HENT:     Head: Normocephalic and atraumatic.  Cardiovascular:     Rate and Rhythm: Normal rate.  Pulmonary:     Effort: Pulmonary effort is normal. No respiratory distress.  Abdominal:     General: Abdomen is flat.     Palpations: Abdomen is soft.     Tenderness: There is abdominal tenderness in the right lower quadrant.  Musculoskeletal:        General: Normal range of motion.     Cervical back: Normal range of motion.  Skin:    General: Skin is warm and dry.  Neurological:  General: No focal deficit present.     Mental Status: She is alert.  Psychiatric:        Mood and Affect: Mood normal.        Behavior: Behavior normal.     ED Results / Procedures / Treatments   Labs (all labs ordered are listed, but only abnormal results are displayed) Labs Reviewed  URINALYSIS, ROUTINE W REFLEX MICROSCOPIC - Abnormal; Notable for the following components:      Result Value   Hgb urine dipstick TRACE (*)    All other components within normal limits  COMPREHENSIVE METABOLIC PANEL - Abnormal; Notable for the following components:   Total Protein 8.2 (*)    All other components within normal limits  CBC WITH DIFFERENTIAL/PLATELET - Abnormal; Notable  for the following components:   Hemoglobin 11.1 (*)    HCT 35.8 (*)    MCH 25.2 (*)    RDW 15.8 (*)    All other components within normal limits  WET PREP, GENITAL  PREGNANCY, URINE  LIPASE, BLOOD  GC/CHLAMYDIA PROBE AMP ([place]) NOT AT [facility]    EKG None  Radiology CT ABDOMEN PELVIS W CONTRAST  Result Date: 11/13/2022 CLINICAL DATA:  RLQ abdominal pain EXAM: CT ABDOMEN AND PELVIS WITH CONTRAST TECHNIQUE: Multidetector CT imaging of the abdomen and pelvis was performed using the standard protocol following bolus administration of intravenous contrast. RADIATION DOSE REDUCTION: This exam was performed according to the departmental dose-optimization program which includes automated exposure control, adjustment of the mA and/or kV according to patient size and/or use of iterative reconstruction technique. CONTRAST:  75mL OMNIPAQUE IOHEXOL 300 MG/ML  SOLN COMPARISON:  None Available. FINDINGS: Lower chest: The lung bases are clear. No pleural effusion. The heart is normal in size. No pericardial effusion. Hepatobiliary: The liver is normal in size. Non-cirrhotic configuration. No suspicious mass. These is mild diffuse hepatic steatosis. No intrahepatic or extrahepatic bile duct dilation. No calcified gallstones. Normal gallbladder wall thickness. No pericholecystic inflammatory changes. Pancreas: Unremarkable. No pancreatic ductal dilatation or surrounding inflammatory changes. Spleen: Within normal limits. No focal lesion. Adrenals/Urinary Tract: Adrenal glands are unremarkable. No suspicious renal mass. No hydronephrosis. No renal or ureteric calculi. Unremarkable urinary bladder. Stomach/Bowel: No disproportionate dilation of the small or large bowel loops. No evidence of abnormal bowel wall thickening or inflammatory changes. The cecum is in the right lower quadrant. Appendix is not confidently identified however, there is a very thin tubular blind appearing structure originating from the base  of the cecum (marked with electronic arrow sign on series 2 and 5), which is favored to represent a normal appendix. No inflammatory changes noted in the right lower quadrant or throughout the abdomen or pelvis. Vascular/Lymphatic: No ascites or pneumoperitoneum. No abdominal or pelvic lymphadenopathy, by size criteria. No aneurysmal dilation of the major abdominal arteries. Reproductive: The uterus is unremarkable. There are at least 2 hypoattenuating structures in the right adnexa measuring 2.0 by 3.0 and 1.7 x 2.2 cm. These are incompletely characterized on the current examination but favored ovarian in etiology such as ovarian cysts. No discrete fluid-fluid level noted. These may represent dominant follicles versus follicular cyst. However, if clinically indicated, ultrasound pelvis can better characterize these structures. Other: There is a tiny fat containing umbilical hernia. The soft tissues and abdominal wall are otherwise unremarkable. Musculoskeletal: No suspicious osseous lesions. IMPRESSION: 1. No acute inflammatory process identified within the abdomen or pelvis. 2. Hypoattenuating structures in the right adnexa are incompletely characterized on the current examination  but favored to represent dominant follicles versus follicular cysts. If clinically indicated, ultrasound pelvis can better characterize these structures. Electronically Signed   By: Jules Schick M.D.   On: 11/13/2022 16:56    Procedures Procedures    Medications Ordered in ED Medications  iohexol (OMNIPAQUE) 300 MG/ML solution 100 mL (has no administration in time range)    ED Course/ Medical Decision Making/ A&P Clinical Course as of 11/13/22 1842  Thu Nov 13, 2022  1841 Pending ultrasound. If [place] normal, OBGYN follow up.  [CG]    Clinical Course User Index [CG] Al Decant, PA-C                             Medical Decision Making Amount and/or Complexity of Data Reviewed Labs: ordered. Radiology:  ordered.  Risk Prescription drug management.   This patient is a 42 y.o. female who presents to the ED for concern of pelvic pain, this involves an extensive number of treatment options, and is a complaint that carries with it a high risk of complications and morbidity. The emergent differential diagnosis prior to evaluation includes, but is not limited to,  PID, ectopic pregnancy, appendicitis, kidney stone, dysmenorrhea, septic abortion, ruptured ovarian cyst, ovarian torsion, tubo-ovarian abscess, fibroids, endometriosis, diverticulitis, cystitis.  This is not an exhaustive differential.   Past Medical History / Co-morbidities / Social History: Hx hypertension  Physical Exam: Physical exam performed. The pertinent findings include: TTP RLQ  Lab Tests: I ordered, and personally interpreted labs.  The pertinent results include:  no acute laboratory abnormalities   Imaging Studies: I ordered imaging studies including CT abdomen pelvis, pelvic US. I independently visualized and interpreted imaging which showed   1. No acute inflammatory process identified within the abdomen or pelvis. 2. Hypoattenuating structures in the right adnexa are incompletely characterized on the current examination but favored to represent dominant follicles versus follicular cysts. If clinically indicated, ultrasound pelvis can better characterize these structures.  I agree with the radiologist interpretation.  Pelvic US pending at shift change   Medications: I ordered medication including toradol  for pain. Reevaluation of the patient after these medicines showed that the patient improved. I have reviewed the patients home medicines and have made adjustments as needed.   Disposition:  Patient with RLQ pain. CT and labs benign, pelvic US ordered to further evaluate her ovarian cysts and r/o torsion.  This is pending at shift change.  If normal, suspect patient will be stable to be discharged.  Discussed  this with patient who is understanding.  Care handoff to Jannifer Hick, PA-C at shift change. Please see their note for further evaluation and dispo  Final Clinical Impression(s) / ED Diagnoses Final diagnoses:  Right lower quadrant abdominal pain    Rx / DC Orders ED Discharge Orders     None         Vear Clock 11/14/22 0915    Derwood Kaplan, MD 11/14/22 1119
